# Patient Record
Sex: Male | Born: 1965 | Hispanic: No | Marital: Married | Smoking: Never smoker
Health system: Southern US, Community
[De-identification: ages and names within clinical notes are randomized; demographics above are authoritative.]

## PROBLEM LIST (undated history)

## (undated) DIAGNOSIS — K760 Fatty (change of) liver, not elsewhere classified: Secondary | ICD-10-CM

## (undated) DIAGNOSIS — H269 Unspecified cataract: Secondary | ICD-10-CM

## (undated) DIAGNOSIS — R112 Nausea with vomiting, unspecified: Secondary | ICD-10-CM

## (undated) DIAGNOSIS — L219 Seborrheic dermatitis, unspecified: Secondary | ICD-10-CM

## (undated) DIAGNOSIS — D696 Thrombocytopenia, unspecified: Secondary | ICD-10-CM

## (undated) DIAGNOSIS — H1010 Acute atopic conjunctivitis, unspecified eye: Secondary | ICD-10-CM

## (undated) DIAGNOSIS — M1990 Unspecified osteoarthritis, unspecified site: Secondary | ICD-10-CM

## (undated) DIAGNOSIS — I10 Essential (primary) hypertension: Secondary | ICD-10-CM

## (undated) DIAGNOSIS — Z9889 Other specified postprocedural states: Secondary | ICD-10-CM

## (undated) HISTORY — DX: Unspecified cataract: H26.9

## (undated) HISTORY — DX: Seborrheic dermatitis, unspecified: L21.9

## (undated) HISTORY — DX: Unspecified osteoarthritis, unspecified site: M19.90

## (undated) HISTORY — PX: HERNIA REPAIR: SHX51

## (undated) HISTORY — PX: HEMORRHOID BANDING: SHX5850

## (undated) HISTORY — DX: Acute atopic conjunctivitis, unspecified eye: H10.10

## (undated) HISTORY — DX: Thrombocytopenia, unspecified: D69.6

## (undated) HISTORY — DX: Fatty (change of) liver, not elsewhere classified: K76.0

## (undated) HISTORY — DX: Essential (primary) hypertension: I10

## (undated) HISTORY — PX: COLONOSCOPY: SHX174

---

## 2008-04-10 ENCOUNTER — Encounter: Payer: Self-pay | Admitting: Unknown Physician Specialty

## 2009-08-09 ENCOUNTER — Ambulatory Visit: Payer: Self-pay | Admitting: Internal Medicine

## 2009-09-09 ENCOUNTER — Ambulatory Visit: Payer: Self-pay | Admitting: Internal Medicine

## 2009-09-25 ENCOUNTER — Ambulatory Visit: Payer: Self-pay | Admitting: Internal Medicine

## 2009-10-07 ENCOUNTER — Emergency Department: Payer: Self-pay | Admitting: Unknown Physician Specialty

## 2009-10-09 ENCOUNTER — Ambulatory Visit: Payer: Self-pay | Admitting: Internal Medicine

## 2009-11-09 ENCOUNTER — Ambulatory Visit: Payer: Self-pay | Admitting: Internal Medicine

## 2010-01-13 ENCOUNTER — Ambulatory Visit: Payer: Self-pay | Admitting: Internal Medicine

## 2010-02-07 ENCOUNTER — Ambulatory Visit: Payer: Self-pay | Admitting: Internal Medicine

## 2010-04-09 ENCOUNTER — Ambulatory Visit: Payer: Self-pay | Admitting: Internal Medicine

## 2010-04-14 ENCOUNTER — Ambulatory Visit: Payer: Self-pay | Admitting: Internal Medicine

## 2010-05-09 ENCOUNTER — Ambulatory Visit: Payer: Self-pay | Admitting: Internal Medicine

## 2010-11-24 ENCOUNTER — Emergency Department: Payer: Self-pay | Admitting: Emergency Medicine

## 2010-12-04 ENCOUNTER — Ambulatory Visit: Admit: 2010-12-04 | Payer: Self-pay | Admitting: Cardiovascular Disease

## 2011-12-16 ENCOUNTER — Emergency Department: Payer: Self-pay | Admitting: Emergency Medicine

## 2012-08-15 ENCOUNTER — Telehealth: Payer: Self-pay

## 2012-08-15 NOTE — Telephone Encounter (Signed)
Dr Katrinka Blazing - this is a patient from [place]. He saw you in July for a CPE and his BP meds have run out and the pharmacy refuses to fill them. Can you fix this with the pharmacy or does he have to come in.  [facility] [address] [employer].  Call his wife at [phone] to discuss.

## 2012-08-15 NOTE — Telephone Encounter (Signed)
Left message for him to call me back, so I can advise him the office in [place], [facility]) can renew the medication until patient has follow up here.

## 2012-08-16 NOTE — Telephone Encounter (Signed)
Lm to call me back.  

## 2013-01-06 ENCOUNTER — Ambulatory Visit: Payer: Self-pay

## 2013-04-20 ENCOUNTER — Ambulatory Visit (INDEPENDENT_AMBULATORY_CARE_PROVIDER_SITE_OTHER): Payer: Managed Care, Other (non HMO) | Admitting: Internal Medicine

## 2013-04-20 ENCOUNTER — Encounter: Payer: Self-pay | Admitting: Internal Medicine

## 2013-04-20 VITALS — BP 118/72 | HR 74 | Temp 97.9°F | Resp 16 | Ht 67.0 in | Wt 153.2 lb

## 2013-04-20 DIAGNOSIS — M1990 Unspecified osteoarthritis, unspecified site: Secondary | ICD-10-CM

## 2013-04-20 DIAGNOSIS — M129 Arthropathy, unspecified: Secondary | ICD-10-CM

## 2013-04-20 DIAGNOSIS — I10 Essential (primary) hypertension: Secondary | ICD-10-CM

## 2013-04-20 DIAGNOSIS — E785 Hyperlipidemia, unspecified: Secondary | ICD-10-CM

## 2013-04-20 DIAGNOSIS — R7989 Other specified abnormal findings of blood chemistry: Secondary | ICD-10-CM

## 2013-04-20 DIAGNOSIS — Z1159 Encounter for screening for other viral diseases: Secondary | ICD-10-CM

## 2013-04-20 HISTORY — DX: Unspecified osteoarthritis, unspecified site: M19.90

## 2013-04-20 LAB — RHEUMATOID FACTOR: Rheumatoid fact SerPl-aCnc: 10 [IU]/mL

## 2013-04-20 LAB — IRON AND TIBC: TIBC: 333 ug/dL (ref 215–435)

## 2013-04-20 LAB — COMPREHENSIVE METABOLIC PANEL
ALT: 38 U/L (ref 0–53)
Albumin: 4.3 g/dL (ref 3.5–5.2)
CO2: 24 mEq/L (ref 19–32)
Chloride: 108 mEq/L (ref 96–112)
GFR: 139.47 mL/min (ref >60.00)
Glucose, Bld: 104 mg/dL — ABNORMAL HIGH (ref 70–99)
Potassium: 3.8 mEq/L (ref 3.5–5.1)
Sodium: 138 mEq/L (ref 135–145)
Total Protein: 7.2 g/dL (ref 6.0–8.3)

## 2013-04-20 LAB — C-REACTIVE PROTEIN: CRP: <0.5 mg/dL (ref 0.5–20.0)

## 2013-04-20 LAB — FERRITIN: Ferritin: 273.8 ng/mL (ref 22.0–322.0)

## 2013-04-20 LAB — URIC ACID: Uric Acid, Serum: 6.4 mg/dL (ref 4.0–7.8)

## 2013-04-20 MED ORDER — MELOXICAM 15 MG PO TABS: 15.0000 mg | ORAL_TABLET | Freq: Every day | ORAL | Status: DC

## 2013-04-20 MED ORDER — TRAMADOL HCL 50 MG PO TABS: 50.0000 mg | ORAL_TABLET | Freq: Three times a day (TID) | ORAL | Status: DC | PRN

## 2013-04-20 NOTE — Assessment & Plan Note (Addendum)
Screening serologies for inflammatory arthritis are normal. Right shoulder, left knee and left elbow ordered, but on exam there are no effusions noted and minimal crepitus. Pending evaluation of renal function ,  NSAIDs were prescribed and prn tylenol.

## 2013-04-20 NOTE — Patient Instructions (Addendum)
PYou may use the tramadol every 8 hours as needed for pain  You may start the meloxicam once I review your labs from today.  You can go to the [place] office at [facility] for you x rays today between 8 ann 5 Pm  Return in 3 weeks

## 2013-04-20 NOTE — Progress Notes (Signed)
Patient ID: Julian Bates, male   DOB: 1966-03-21, 47 y.o.   MRN: [id number]  Patient Active Problem List   Diagnosis Date Noted  . Essential hypertension, benign 04/23/2013  . Degenerative arthritis 04/20/2013    Subjective:  CC:   Chief Complaint  Patient presents with  . Establish Care    Mole check below right ear.    HPI:   Julian Bates is a 47 y.o. male who presents as a new patient to establish primary care with the chief complaint of joint pain for several months to a year.  He is Spanish speaking only and is accompanied only by his wife who is providing interpretation today.Marland Kitchen  Specific joint s include left elbow and knee,  And right shoulder.  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  Pulling, pushing and turning aggravate pain., but more recently he has noted pain with everyday use of arm and knee . Seen by an orthopedics who  injected the elbow and rxd a knee brace for the knee.  No x rays were done.  Told his pain was normal , and was not given any NSAIDs o pain relievers and no instructions on use of OTCs.  Knee pain is aggravated by going up and down stairs and has given out on several episodes while going down the stairs without causing any falls.  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  No history of heat or redness.    The cortisone elbow injection was done in February and helped for a while.  He tried to return last month for a second shot  But left after waiting for 40 minutes.   There is a FH of arthritis  In mother at elderly age,  May be RA , but she is in a lot of pain and lives in [place]   Father has prostate Ca ,  PAD, DM, HTN alcohol and tobacco abuse  ,  Now bedbound secondary to amputation and paralysis   History of neutropenia  and anemia  Referred to the [facility] a few  years ago at [facility]  For evaluation .   Past Medical History  Diagnosis Date  . Hypertension     Past Surgical History  Procedure Laterality Date  . Hernia repair      Family History  Problem Relation Age of Onset  . Arthritis Mother   . Arthritis Father   . Cancer Father   . Stroke Father   . Hypertension Father     History   Social History  . Marital Status: Married    Spouse Name: N/A    Number of Children: N/A  . Years of Education: N/A   Occupational History  . Not on file.   Social History Main Topics  . Smoking status: Never Smoker   . Smokeless tobacco: Never Used  . Alcohol Use: Yes     Comment: rarely drinks due to Red blood cell being low  . Drug Use: No  . Sexually Active: Yes   Other Topics Concern  . Not on file   Social History Narrative  . No narrative on file       @ALLHX @    Review of Systems:   The remainder of the review of systems was negative except those addressed in the HPI.       Objective:  BP 118/72  Pulse 74  Temp(Src) 97.9 F (36.6 C) (Oral)  Resp 16  Ht 5\' 7"  (1.702 m)  Wt 153 lb 4 oz (69.514 kg)  BMI 24 kg/m2  SpO2 99%  General appearance: alert, cooperative and appears stated age Ears: normal TM's and external ear canals both ears Throat: lips, mucosa, and tongue normal; teeth and gums normal Neck: no adenopathy, no carotid bruit, supple, symmetrical, trachea midline and thyroid not enlarged, symmetric, no tenderness/mass/nodules Back: symmetric, no curvature. ROM normal. No CVA tenderness. Lungs: clear to auscultation bilaterally Heart: regular rate and rhythm, S1, S2 normal, no murmur, click, rub or gallop Abdomen: soft, non-tender; bowel sounds normal; no masses,  no organomegaly Pulses: 2+ and symmetric Skin: Skin color, texture, turgor normal. No rashes or lesions Lymph nodes: Cervical, supraclavicular, and axillary nodes normal.  Assessment and Plan:  Degenerative arthritis Screening  serologies for inflammatory arthritis are normal. Right shoulder, left knee and left elbow ordered, but on exam there are no effusions noted and minimal crepitus. Pending evaluation of renal function ,  NSAIDs were prescribed and prn tylenol.    Essential hypertension, benign Well controlled on current regimen. Renal function is normal , no changes today.   Updated Medication List Outpatient Encounter Prescriptions as of 04/20/2013  Medication Sig Dispense Refill  . acetic acid-hydrocortisone (VOSOL-HC) otic solution Place 3 drops into both ears 2 (two) times daily.      Marland Kitchen atenolol (TENORMIN) 50 MG tablet Take 50 mg by mouth daily.      . enalapril (VASOTEC) 20 MG tablet Take 20 mg by mouth daily.      . valACYclovir (VALTREX) 1000 MG tablet Take 1,000 mg by mouth 2 (two) times daily.      . meloxicam (MOBIC) 15 MG tablet Take 1 tablet (15 mg total) by mouth daily.  30 tablet  0  . traMADol (ULTRAM) 50 MG tablet Take 1 tablet (50 mg total) by mouth every 8 (eight) hours as needed for pain.  90 tablet  1   No facility-administered encounter medications on file as of 04/20/2013.     Orders Placed This Encounter  Procedures  . DG Shoulder Right  . DG Elbow Complete Left  . DG Knee Complete 4 Views Left  . Hepatitis C antibody  . Hepatitis B surface antigen  . Hepatitis B core antibody, total  . Uric acid  . Rheumatoid factor  . C-reactive protein  . ANA w/Reflex if Positive  . Comprehensive metabolic panel  . Lipid panel  . Ferritin  . Iron and TIBC    Return in about 3 weeks (around 05/11/2013).

## 2013-04-21 LAB — ANA W/REFLEX IF POSITIVE: Anti Nuclear Antibody(ANA): NEGATIVE

## 2013-04-23 ENCOUNTER — Encounter: Payer: Self-pay | Admitting: Internal Medicine

## 2013-04-23 DIAGNOSIS — I10 Essential (primary) hypertension: Secondary | ICD-10-CM | POA: Insufficient documentation

## 2013-04-23 NOTE — Assessment & Plan Note (Signed)
Well controlled on current regimen. Renal function is normal , no changes today.

## 2013-04-25 ENCOUNTER — Telehealth: Payer: Self-pay | Admitting: *Deleted

## 2013-04-25 ENCOUNTER — Telehealth: Payer: Self-pay | Admitting: Internal Medicine

## 2013-04-25 MED ORDER — CEPHALEXIN 500 MG PO TABS: 500.0000 mg | ORAL_TABLET | Freq: Three times a day (TID) | ORAL | Status: DC

## 2013-04-25 NOTE — Telephone Encounter (Signed)
Please confirm if he is taking meloxicam as well.  Tramadol and meloxicam were prescribed. He has not had the x rays yet that i ordered,  He can take meloxicam once dailky and otc tylenol 500 mg every 6 hours.  For the redness, I HAVE CALLED in cephalexin, an antibiotic to take.  contineu cool compresses to arm

## 2013-04-25 NOTE — Telephone Encounter (Signed)
Patient Information:  Caller Name: Aldean Jewett  Phone: [phone]  Patient: Julian Bates, Julian Bates  Gender: Male  DOB: 1966/03/13  Age: 47 Years  PCP: Duncan Dull (Adults only)  Office Follow Up:  Does the office need to follow up with this patient?: No  Instructions For The Office: N/A  RN Note:  Afebrile. OV 04/20/2013 for arthritis and had blood drawm. The left arm, where blood was taken is ecchymotic,edemetous and sore (woke him up from sleep last night). Today, 04/25/2013 no cardiac symptoms or pain. Pain is coming and going from where the "needle stick" was. Also,Tramadol was ordered for arthritic pain and after 2 hours of taking the medication, he told his wife he got diaphoretic, heart racing, hot and nauseated and had to leave work (04/24/2013),which he would normally not do. She is concerned about the pain in arm from needle stick and states the ecchymotic area is very large, whole forearm area and the edema is not decreasing with cold compress. He also needs an alternate pain medication from the Tramadol. RN/CAN offered appointment today and she refused, her husband just left for work. RN/CAN called office to schedule appointment for tomorrow, 04/26/2013 and "Amber" scheduled for 9:30am, Millie confirmed. RN/CAN advised calling back if any other issues. Wife stated it was getting better and the edema is not the whole arm just a lump by the injection site. It is painful and RN/CAN advised going to the ED if unable to do his job.  Symptoms  Reason For Call & Symptoms: prescribed medication for joint pain in his elbow and got sick from it yesterday, 04/24/2013. Nauseated, hot, diaphoretic.  Reviewed Health History In EMR: Yes  Reviewed Medications In EMR: Yes  Reviewed Allergies In EMR: Yes  Reviewed Surgeries / Procedures: Yes  Date of Onset of Symptoms: 04/24/2013  Guideline(s) Used:  Arm Pain  Disposition Per Guideline:   See Today or Tomorrow in Office  Reason For Disposition Reached:    Localized pain, redness or hard lump along vein  Advice Given:  N/A  Patient Will Follow Care Advice:  YES  Appointment Scheduled:  04/26/2013 09:30:00 Appointment Scheduled Provider:  Orville Govern

## 2013-04-25 NOTE — Telephone Encounter (Signed)
FYI: has appt tomorrow (6/18) with Merton Border, NP

## 2013-04-25 NOTE — Telephone Encounter (Signed)
Patient sates new medications causing nausea and vomiting and would like something different prescribed please advise.

## 2013-04-26 ENCOUNTER — Encounter: Payer: Self-pay | Admitting: Adult Health

## 2013-04-26 ENCOUNTER — Ambulatory Visit (INDEPENDENT_AMBULATORY_CARE_PROVIDER_SITE_OTHER): Payer: Managed Care, Other (non HMO) | Admitting: Adult Health

## 2013-04-26 VITALS — BP 112/68 | HR 73 | Temp 98.5°F | Resp 12 | Wt 155.5 lb

## 2013-04-26 DIAGNOSIS — G8929 Other chronic pain: Secondary | ICD-10-CM | POA: Insufficient documentation

## 2013-04-26 DIAGNOSIS — M25522 Pain in left elbow: Secondary | ICD-10-CM

## 2013-04-26 DIAGNOSIS — M25529 Pain in unspecified elbow: Secondary | ICD-10-CM

## 2013-04-26 DIAGNOSIS — M25569 Pain in unspecified knee: Secondary | ICD-10-CM

## 2013-04-26 DIAGNOSIS — M25562 Pain in left knee: Secondary | ICD-10-CM | POA: Insufficient documentation

## 2013-04-26 DIAGNOSIS — R52 Pain, unspecified: Secondary | ICD-10-CM

## 2013-04-26 MED ORDER — ESOMEPRAZOLE MAGNESIUM 40 MG PO PACK: 40.0000 mg | PACK | Freq: Every day | ORAL | Status: DC

## 2013-04-26 MED ORDER — HYDROCORTISONE-ACETIC ACID 1-2 % OT SOLN: 3.0000 [drp] | Freq: Two times a day (BID) | OTIC | Status: DC

## 2013-04-26 MED ORDER — ACETAMINOPHEN 500 MG PO TABS: 500.0000 mg | ORAL_TABLET | Freq: Four times a day (QID) | ORAL | Status: DC | PRN

## 2013-04-26 NOTE — Assessment & Plan Note (Addendum)
Acute pain of left antecubital fossa. Started on Keflex for imperical tx for cellulitis. Arm improved; however, pain continues. Patient will take mobic with tylenol. He understands that mobic is once daily and tylenol can be taken every 6 hours. He is to take one dose of tylenol with the mobic for potentiation. Start nexium while on mobic. Note, greater than 25 min were spent in face to face communication with pain and family in the assessment, planning and implementation of problems listed during today's visit.

## 2013-04-26 NOTE — Assessment & Plan Note (Addendum)
Refer to ortho. Patient has received cortisone injection in left elbow (12/2012) with good results. Recent screening serologies for inflammatory arthritis are normal. Exam reveals no effusions of left elbow. Continue mobic 15 mg daily, tylenol 1000 mg no more than 4 times a day. Instructed that tylenol has maximum of 4000 mg daily that is not to be exceeded.

## 2013-04-26 NOTE — Progress Notes (Signed)
  Subjective:    Patient ID: Julian Bates, male    DOB: 18-Aug-1966, 47 y.o.   MRN: [id number]  HPI  Patient is a pleasant 47 year old Spanish-speaking male who presents to clinic with left arm edema, erythema, pain subsequent to recent blood draw. He was prescribed tramadol however he became nauseated and diaphoretic after taking medication. Patient was prescribed Keflex to treat empirically for cellulitis and was told to make an appointment for followup. Patient reports chronic pain in his left elbow. This new pain is in the antecubital fossa. He reports sharp like sensation with mobility and pain that keeps him up during the night. Denies fever, chills. He reports inflammation has subsided as well as the erythema.  For his chronic left elbow pain, patient has received cortisone injection x1 back in February of 2014. He also has degenerative arthritis changes of his left knee with chronic pain. He has seen an orthopedic in the past but felt that his knee pain that was not addressed. He would like a referral to a new orthopedic.   Current Outpatient Prescriptions on File Prior to Visit  Medication Sig Dispense Refill  . acetic acid-hydrocortisone (VOSOL-HC) otic solution Place 3 drops into both ears 2 (two) times daily.      Marland Kitchen atenolol (TENORMIN) 50 MG tablet Take 50 mg by mouth daily.      . enalapril (VASOTEC) 20 MG tablet Take 20 mg by mouth daily.      . valACYclovir (VALTREX) 1000 MG tablet Take 1,000 mg by mouth 2 (two) times daily.      . Cephalexin 500 MG tablet Take 1 tablet (500 mg total) by mouth 3 (three) times daily.  21 tablet  0  . meloxicam (MOBIC) 15 MG tablet Take 1 tablet (15 mg total) by mouth daily.  30 tablet  0   No current facility-administered medications on file prior to visit.    Review of Systems  Musculoskeletal: Positive for arthralgias. Negative for joint swelling.       Pain in left elbow, antecubital fossa and left knee   BP 112/68  Pulse 73   Temp(Src) 98.5 F (36.9 C) (Oral)  Resp 12  Wt 155 lb 8 oz (70.534 kg)  BMI 24.35 kg/m2  SpO2 97%    Objective:   Physical Exam  Constitutional: He is oriented to person, place, and time. He appears well-developed and well-nourished. No distress.  Cardiovascular: Normal rate and regular rhythm.   Pulmonary/Chest: Effort normal. No respiratory distress.  Musculoskeletal: He exhibits tenderness. He exhibits no edema.       Left elbow: He exhibits no effusion and no deformity. Tenderness found. Medial epicondyle, lateral epicondyle and olecranon process tenderness noted.       Left knee: Normal. He exhibits no swelling, no effusion, no deformity and no erythema.       Arms:      Legs: Neurological: He is alert and oriented to person, place, and time.  Psychiatric: He has a normal mood and affect. His behavior is normal. Judgment and thought content normal.       Assessment & Plan:

## 2013-04-26 NOTE — Telephone Encounter (Signed)
Patient is taking the meloxicam  And saw Orville Govern NP today, patient notified as directed.

## 2013-04-26 NOTE — Patient Instructions (Addendum)
  Empieza a tomar el antibiotico como fue indicado.  Continue el Mobic diariamente.   Tome Tylenol 1000 mg no mas de 4 veces al dia. Tylenol tiene un maximo de 4000 mg diario.  Lo estoy refiriendo a un ortopedico para el codo y la rodilla.

## 2013-04-26 NOTE — Addendum Note (Signed)
Addended by: Dennie Bible on: 04/26/2013 10:56 AM   Modules accepted: Orders

## 2013-04-26 NOTE — Assessment & Plan Note (Signed)
Refer to Ortho. Exam reveals crepitus of left knee without any s/s of effusion. Patient has seen ortho in the past but feels his left knee pain was not addressed. Continue mobic and tylenol as instructed. Started nexium while on mobic.

## 2013-05-31 ENCOUNTER — Ambulatory Visit (INDEPENDENT_AMBULATORY_CARE_PROVIDER_SITE_OTHER)
Admission: RE | Admit: 2013-05-31 | Discharge: 2013-05-31 | Disposition: A | Discharge status: Home / Self Care | Payer: Managed Care, Other (non HMO) | Source: Ambulatory Visit | Attending: Family Medicine | Admitting: Family Medicine

## 2013-05-31 ENCOUNTER — Institutional Professional Consult (permissible substitution): Payer: Managed Care, Other (non HMO) | Admitting: Family Medicine

## 2013-05-31 ENCOUNTER — Ambulatory Visit (INDEPENDENT_AMBULATORY_CARE_PROVIDER_SITE_OTHER): Payer: Managed Care, Other (non HMO) | Admitting: Family Medicine

## 2013-05-31 ENCOUNTER — Encounter: Payer: Self-pay | Admitting: Family Medicine

## 2013-05-31 VITALS — BP 100/70 | HR 70 | Temp 97.5°F | Ht 67.0 in | Wt 158.5 lb

## 2013-05-31 DIAGNOSIS — M25529 Pain in unspecified elbow: Secondary | ICD-10-CM

## 2013-05-31 DIAGNOSIS — M25569 Pain in unspecified knee: Secondary | ICD-10-CM

## 2013-05-31 DIAGNOSIS — M7702 Medial epicondylitis, left elbow: Secondary | ICD-10-CM

## 2013-05-31 DIAGNOSIS — M7711 Lateral epicondylitis, right elbow: Secondary | ICD-10-CM

## 2013-05-31 DIAGNOSIS — M25562 Pain in left knee: Secondary | ICD-10-CM

## 2013-05-31 DIAGNOSIS — M7700 Medial epicondylitis, unspecified elbow: Secondary | ICD-10-CM

## 2013-05-31 DIAGNOSIS — G8929 Other chronic pain: Secondary | ICD-10-CM

## 2013-05-31 DIAGNOSIS — M7710 Lateral epicondylitis, unspecified elbow: Secondary | ICD-10-CM

## 2013-05-31 DIAGNOSIS — M222X1 Patellofemoral disorders, right knee: Secondary | ICD-10-CM

## 2013-05-31 DIAGNOSIS — M7712 Lateral epicondylitis, left elbow: Secondary | ICD-10-CM

## 2013-05-31 NOTE — Progress Notes (Signed)
Date:  05/31/2013   Name:  Julian Bates   DOB:  03-19-1966   MRN:  [id number] Gender: male Age: 47 y.o.  Primary Physician: Duncan Dull, MD  Dear Dr. Hassie Bruce,  Thank you for having me see Julian Bates in consultation today at [facility] at [facility] for his problem with elbow and knee pain.  As you may recall, he is a 47 y.o. year old male with a history of longstanding left elbow pain, current significant R elbow pain and LEFT > RIGHT knee pain.   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 Step and weight - gives out sometimes. Weak. No effusions, and he denies locking up of the joint.  When going up steps and ladders, it will hurt a lot. He uses a knee brace every day - found one [facility] that helps. Also saw Dr. Richardson Landry twice. Feels some crepitus. No prior knee surgery.  Broke L elbow. Wore cast as a ytounger man. Has had lateral elbow pain at lateral epicondyle. He now does repetitive movements with his hands at work. He had an ECRB injection by Dr. Eulah Pont which helped about 8-9 months. He also is using elbow braces that help some. He has now developed medial epicondylar pain.  Right now his R elbow is the most symptomatic at LE.   Past Medical History  Diagnosis Date  . Hypertension     Past Surgical History  Procedure Laterality Date  . Hernia repair      History   Social History  . Marital Status: Married    Spouse Name: N/A    Number of Children: N/A  . Years of Education: N/A   Social History Main Topics  . Smoking status: Never Smoker   . Smokeless tobacco: Never Used  . Alcohol Use: Yes     Comment: rarely drinks due to Red blood cell being low  . Drug Use: No  . Sexually Active: Yes   Other Topics Concern  . None   Social History Narrative  . None    Family History  Problem Relation Age of Onset  . Arthritis Mother   . Arthritis Father   . Cancer Father   . Stroke Father   .  Hypertension Father     Medications and Allergies reviewed  Outpatient Prescriptions Prior to Visit  Medication Sig Dispense Refill  . atenolol (TENORMIN) 50 MG tablet Take 50 mg by mouth daily.      . enalapril (VASOTEC) 20 MG tablet Take 20 mg by mouth daily.      . valACYclovir (VALTREX) 1000 MG tablet Take 1,000 mg by mouth 2 (two) times daily.      Marland Kitchen acetaminophen (TYLENOL) 500 MG tablet Take 1 tablet (500 mg total) by mouth every 6 (six) hours as needed for pain.  30 tablet  0  . acetic acid-hydrocortisone (VOSOL-HC) otic solution Place 3 drops into both ears 2 (two) times daily.  10 mL  1  . Cephalexin 500 MG tablet Take 1 tablet (500 mg total) by mouth 3 (three) times daily.  21 tablet  0  . esomeprazole (NEXIUM) 40 MG packet Take 40 mg by mouth daily before breakfast.  30 each  12  . meloxicam (MOBIC) 15 MG tablet Take 1 tablet (15 mg total) by mouth daily.  30 tablet  0   No facility-administered medications prior to visit.    Review of Systems:  GEN: No fevers, chills. Nontoxic. Primarily MSK c/o today. MSK: Detailed in the HPI GI: tolerating PO intake without difficulty Neuro: No numbness, parasthesias, or tingling associated. Otherwise, the pertinent positives and negatives are listed above and in the HPI, otherwise a full review of systems has been reviewed and is negative unless noted positive.   Physical Examination: Filed Vitals:   05/31/13 0843  BP: 100/70  Pulse: 70  Temp: 97.5 F (36.4 C)  TempSrc: Oral  Height: 5\' 7"  (1.702 m)  Weight: 158 lb 8 oz (71.895 kg)  SpO2: 99%     GEN: WDWN, NAD, Non-toxic, Alert & Oriented x 3 HEENT: Atraumatic, Normocephalic.  Ears and Nose: No external deformity. EXTR: No clubbing/cyanosis/edema NEURO: Normal gait.  PSYCH: Normally interactive. Conversant. Not depressed or anxious appearing.  Calm demeanor.   Knee: B Gait: Normal heel toe pattern ROM: WNL Effusion: neg Echymosis or edema: none Patellar tendon  NT Painful PLICA: neg Patellar grind: POS Medial and lateral patellar facet loading: POSITIVE, but only mildly medial and lateral joint lines:NT Mcmurray's neg Flexion-pinch neg Varus and valgus stress: stable Lachman: neg Ant and Post drawer: neg Hip abduction, IR, ER: WNL Hip flexion str: 5/5 Hip abd: 5/5 Quad: 5/5 VMO atrophy: MILD Hamstring concentric and eccentric: 5/5  Elbow: B Ecchymosis or edema: neg ROM: full flexion, extension, pronation, supination Shoulder ROM: Full Flexion: 5/5 Extension: 5/5, PAINFUL Supination: 5/5, PAINFUL Pronation: 5/5 Wrist ext: 5/5 Wrist flexion: 5/5 No gross bony abnormality Varus and Valgus stress: stable ECRB tenderness: YES, TTP Medial epicondyle: TTP, but only on the LEFT Lateral epicondyle, resisted wrist extension from wrist full pronation and flexion: PAINFUL grip: 5/5  sensation intact Tinel's, Elbow: negative   Objective Data:  Dg Elbow Complete Left  05/31/2013   *RADIOLOGY REPORT*  Clinical Data: Pain  LEFT ELBOW - COMPLETE 3+ VIEW  Comparison: None.  Findings: Frontal, lateral, and bilateral oblique views were obtained.  No fracture, dislocation, or effusion.  Joint spaces appear intact.  There is a small spur arising from the olecranon.  IMPRESSION: Small olecranon spur.  Study otherwise unremarkable.   Original Report Authenticated By: Bretta Bang, M.D.   Dg Knee Ap/lat W/sunrise Left  05/31/2013   *RADIOLOGY REPORT*  Clinical Data: Left knee pain.  No trauma.  DG KNEE - 3 VIEWS  Comparison: None.  Findings: No fracture or bone lesion.  The knee joint is normally spaced and aligned.  No joint effusion.  The soft tissues are unremarkable.  IMPRESSION: Normal left knee radiographs.   Original Report Authenticated By: Amie Portland, M.D.    Assessment and Plan:  Impression: 1. 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 2. Medial epicondylitis L elbow 3. 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 No significant OA on radiographs.    Recommendations:  1. Lateral Epicondylitis and Medial: Elbow anatomy was reviewed, and tendinopathy was explained. Basic rehab and stretching reviewed.  Use counterforce strap if working or using hands. (Obtain 2nd)  Emphasized stretching an cross-friction massage Emphasized proper palms up lifting biomechanics to unload ECRB  Lateral Epicondylitis Injection, RIGHT Verbal consent was obtained from the patient. Risks, benefits, and alternatives were discussed. Potential complications including loss of pigment, atrophy, and rare risk of infection were discussed. Prepped with Chloraprep and Ethyl Chloride used for anesthesia. Under sterile conditions, the patient was injected at the point of maximal tenderness at the ECRB  tendon with 2/3 cc of Lidocaine 1% and 2/3 cc of Depo-Medrol 40 mg. Decreased pain after injection. No complications.  Needle size: 22 gauge 1 1/2 inch   2. Reassurance that crepitus and PF changes are in normal spectrum for his work history at age 54. Keep fitness level high, strength high, and prn use of knee brace is fine.  We will see the patient back if he is having problems.  Thank you for having [place] see Julian Bates in consultation.  Feel free to contact me with any questions.  Signed, Elpidio Galea. Rochella Benner, MD, CAQ Sports Medicine [employer] at [facility] [address] [place], [place] [address] Phone: [phone] Fax: [phone]

## 2013-07-21 ENCOUNTER — Other Ambulatory Visit: Payer: Self-pay | Admitting: *Deleted

## 2013-07-21 MED ORDER — ATENOLOL 50 MG PO TABS: 50.0000 mg | ORAL_TABLET | Freq: Every day | ORAL | Status: DC

## 2013-08-07 ENCOUNTER — Telehealth: Payer: Self-pay | Admitting: *Deleted

## 2013-08-07 NOTE — Telephone Encounter (Signed)
Refill Request  Desonide 0.05% cream  #30   Apply to face and ears once daily as needed

## 2013-08-14 ENCOUNTER — Other Ambulatory Visit: Payer: Self-pay | Admitting: *Deleted

## 2013-08-14 MED ORDER — ENALAPRIL MALEATE 20 MG PO TABS: 20.0000 mg | ORAL_TABLET | Freq: Every day | ORAL | Status: DC

## 2013-08-14 NOTE — Telephone Encounter (Signed)
Script sent  

## 2013-08-31 IMAGING — CR DG KNEE AP/LAT W/ SUNRISE*L*
3 series · 3 of 3 positions shown · non-contrast
Comparison: None.

CLINICAL DATA: Left knee pain.  No trauma.

DG KNEE - 3 VIEWS

[view not recorded (1 of 3)]
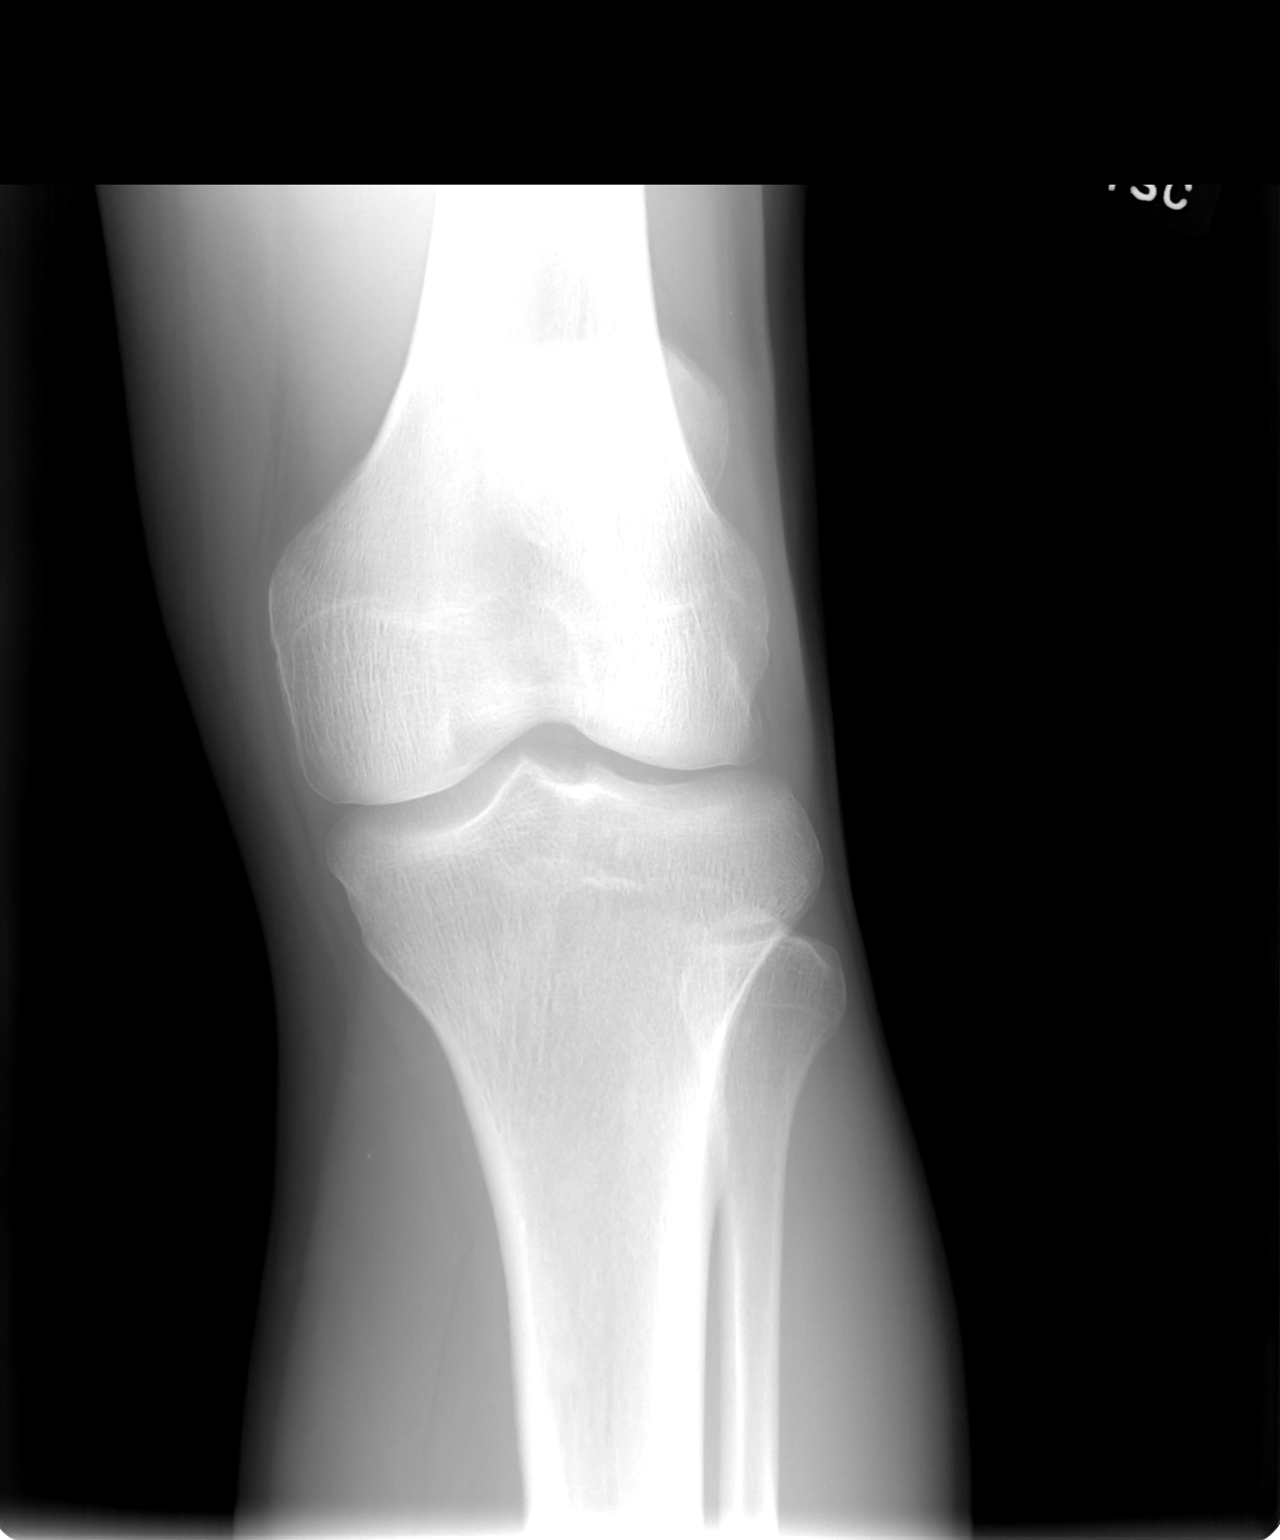

[view not recorded (2 of 3)]
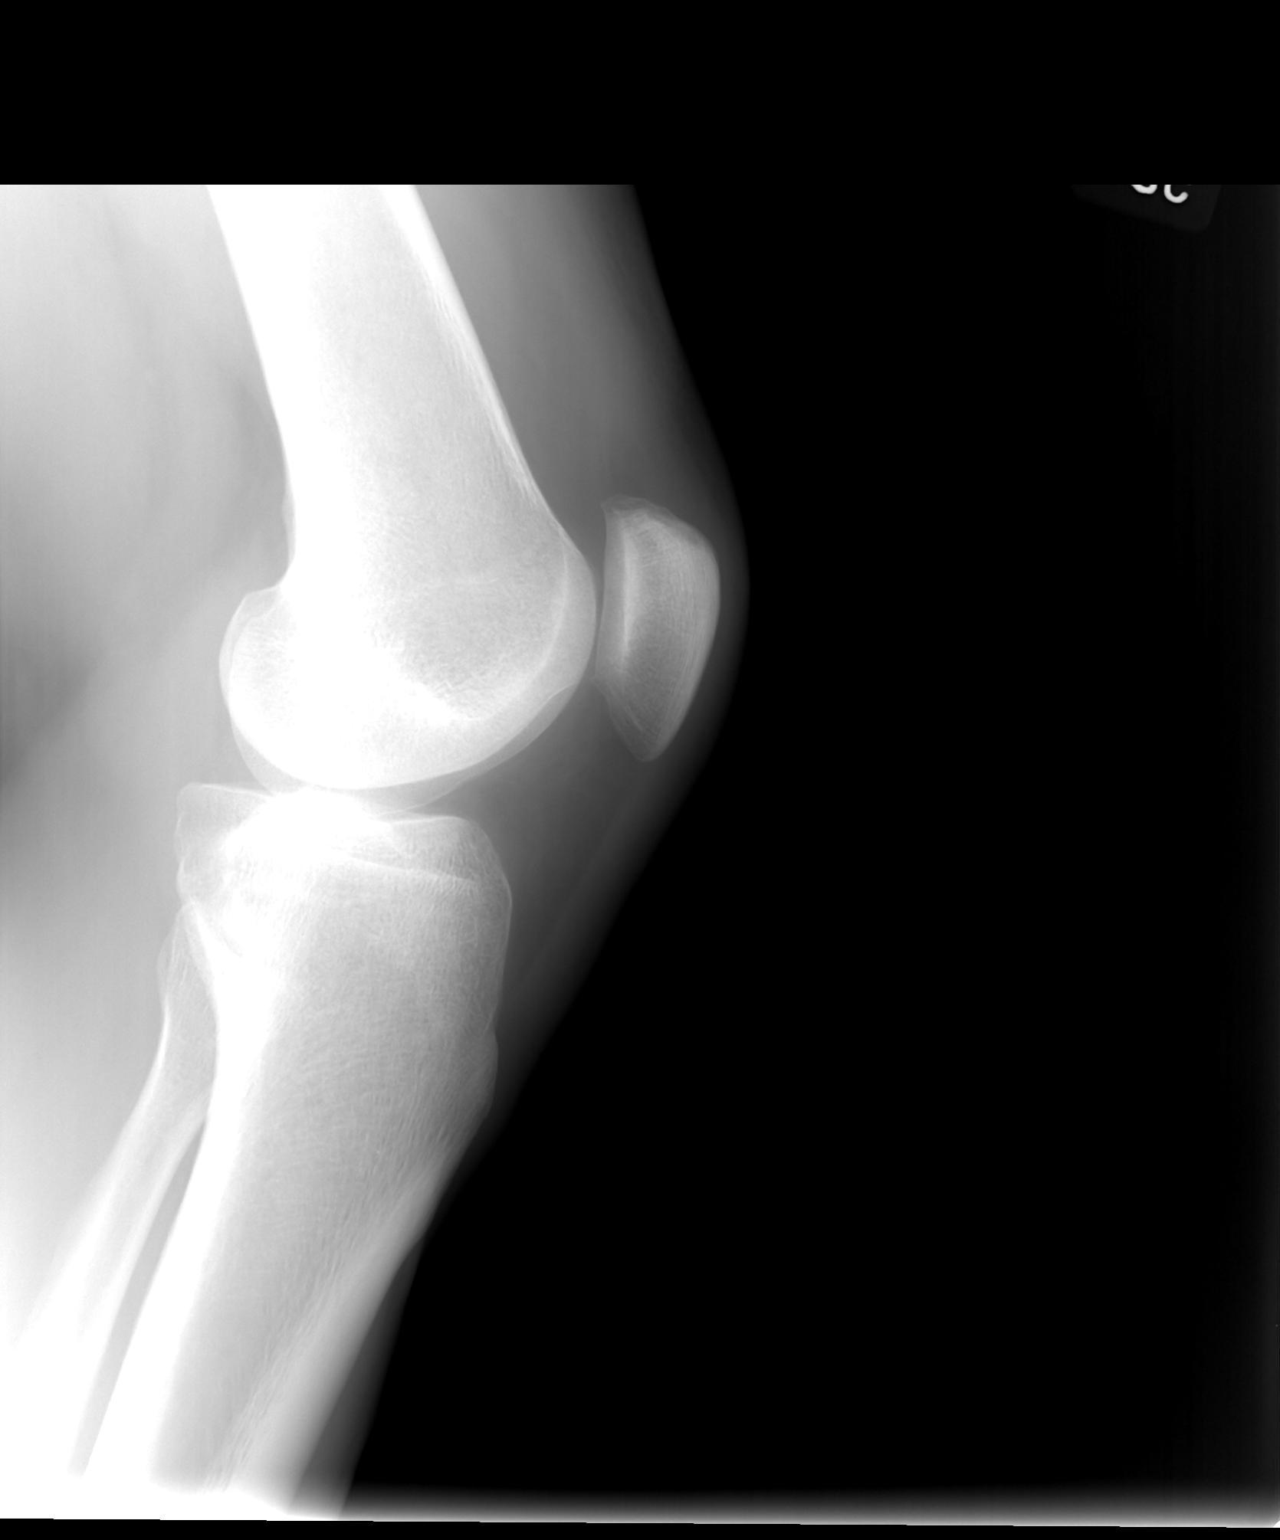

[view not recorded (3 of 3)]
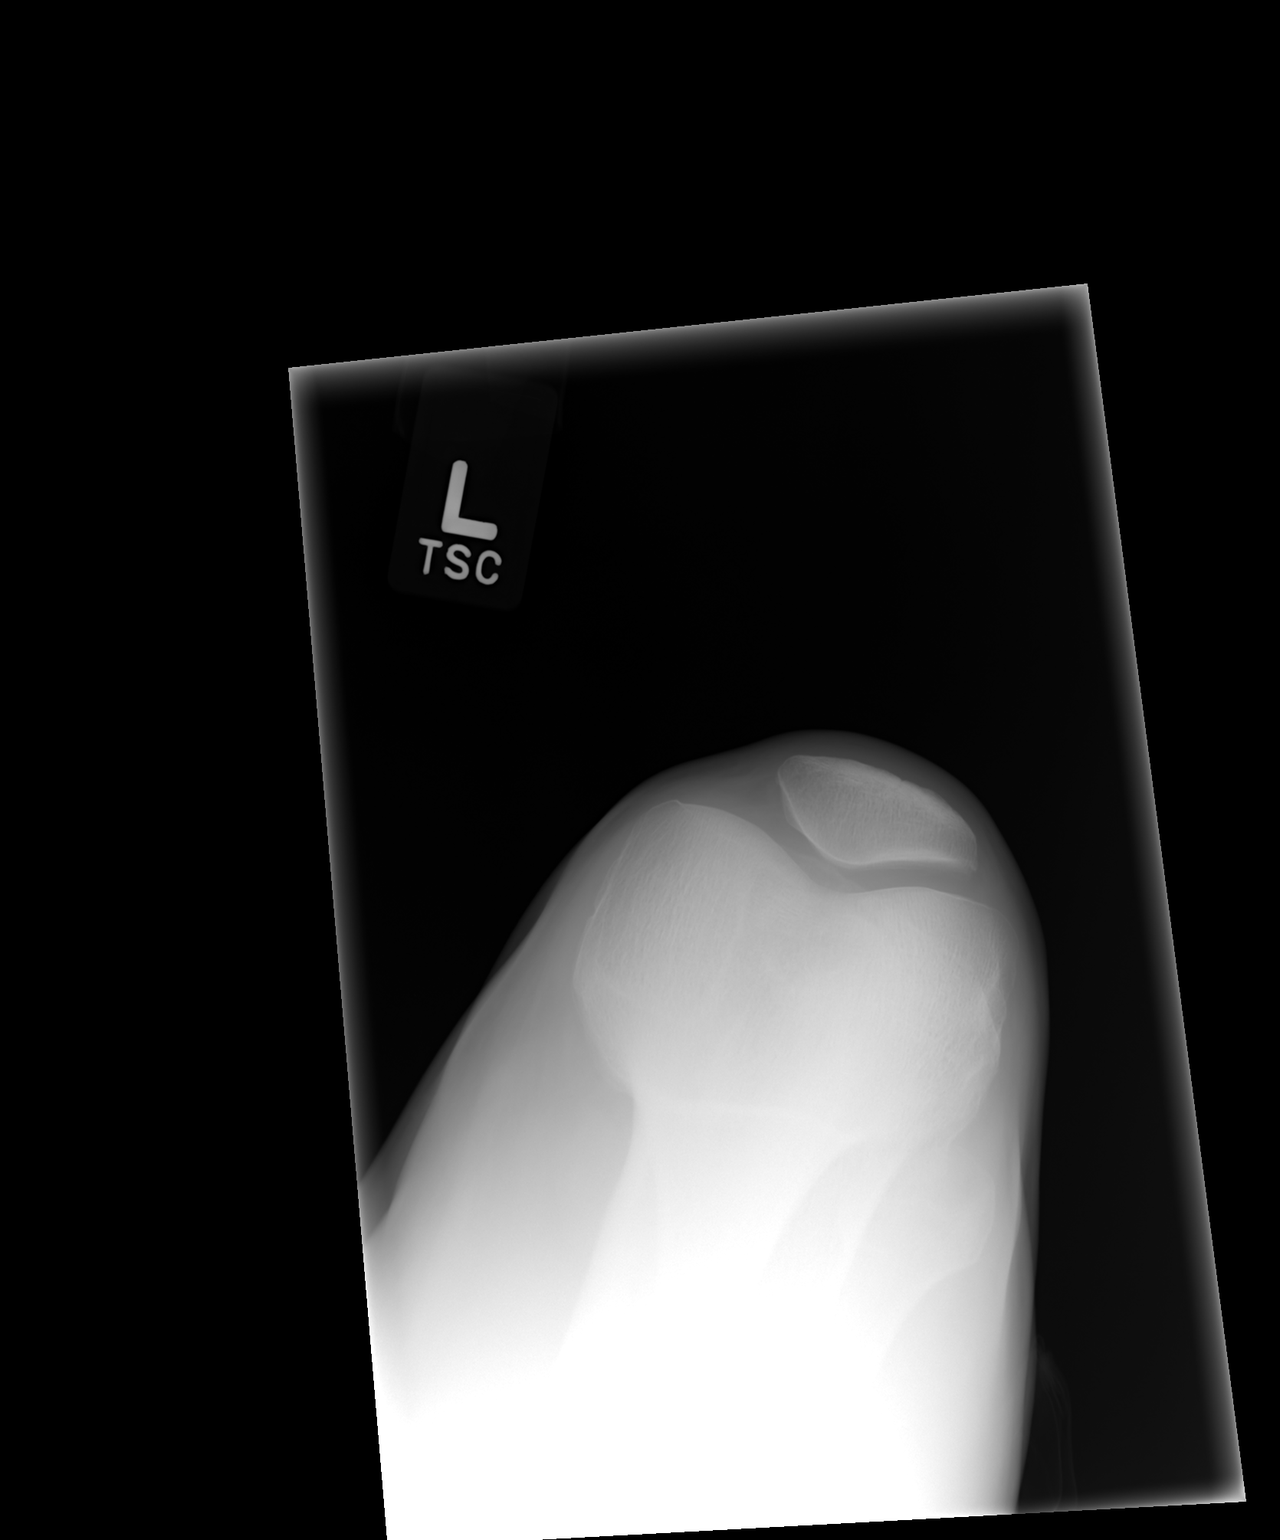

[3 of 3 positions shown; findings below may reference images not displayed]

FINDINGS: No fracture or bone lesion.  The knee joint is normally
spaced and aligned.  No joint effusion.  The soft tissues are
unremarkable.
IMPRESSION: Normal left knee radiographs.

## 2014-01-06 ENCOUNTER — Ambulatory Visit (INDEPENDENT_AMBULATORY_CARE_PROVIDER_SITE_OTHER): Payer: Managed Care, Other (non HMO) | Admitting: Family Medicine

## 2014-01-06 VITALS — BP 110/80 | HR 84 | Temp 97.3°F | Ht 65.25 in | Wt 158.0 lb

## 2014-01-06 DIAGNOSIS — Z23 Encounter for immunization: Secondary | ICD-10-CM

## 2014-01-06 DIAGNOSIS — K644 Residual hemorrhoidal skin tags: Secondary | ICD-10-CM

## 2014-01-06 DIAGNOSIS — I10 Essential (primary) hypertension: Secondary | ICD-10-CM

## 2014-01-06 DIAGNOSIS — H6120 Impacted cerumen, unspecified ear: Secondary | ICD-10-CM

## 2014-01-06 DIAGNOSIS — K625 Hemorrhage of anus and rectum: Secondary | ICD-10-CM

## 2014-01-06 DIAGNOSIS — K5900 Constipation, unspecified: Secondary | ICD-10-CM

## 2014-01-06 LAB — POCT CBC
Granulocyte percent: 70.3 %G (ref 37–80)
HCT, POC: 50.3 % (ref 43.5–53.7)
HEMOGLOBIN: 16.5 g/dL (ref 14.1–18.1)
Lymph, poc: 1.6 (ref 0.6–3.4)
MCH: 32.7 pg — AB (ref 27–31.2)
MCHC: 32.8 g/dL (ref 31.8–35.4)
MCV: 99.9 fL — AB (ref 80–97)
MID (cbc): 0.5 (ref 0–0.9)
MPV: 9.7 fL (ref 0–99.8)
POC Granulocyte: 5 (ref 2–6.9)
POC LYMPH PERCENT: 22 %L (ref 10–50)
POC MID %: 7.7 % (ref 0–12)
Platelet Count, POC: 149 10*3/uL (ref 142–424)
RBC: 5.04 M/uL (ref 4.69–6.13)
RDW, POC: 13 %
WBC: 7.1 10*3/uL (ref 4.6–10.2)

## 2014-01-06 LAB — IFOBT (OCCULT BLOOD): IFOBT: NEGATIVE

## 2014-01-06 MED ORDER — HYDROCORTISONE ACETATE 25 MG RE SUPP: 25.0000 mg | Freq: Two times a day (BID) | RECTAL | Status: DC

## 2014-01-06 MED ORDER — ATENOLOL 50 MG PO TABS: 50.0000 mg | ORAL_TABLET | Freq: Every day | ORAL | Status: DC

## 2014-01-06 MED ORDER — ENALAPRIL MALEATE 20 MG PO TABS: 20.0000 mg | ORAL_TABLET | Freq: Every day | ORAL | Status: DC

## 2014-01-06 NOTE — Patient Instructions (Signed)
Constipación - Adulto   (Constipation, Adult)   Constipación significa que una persona tiene menos de 3 evacuaciones en una semana, hay dificultad para evacuar el intestino, o las heces son secas, duras, o más grandes que lo normal. A medida que envejecemos el estreñimiento es más común. Si intenta curar el estreñimiento con medicamentos que producen la evacuación intestinal (laxantes), el problema puede empeorar. El uso prolongado de laxantes puede hacer que los músculos del colon se debiliten. Una dieta baja en fibra, no tomar suficientes líquidos y el uso de ciertos medicamentos pueden empeorar el estreñimiento.   CAUSAS   · Ciertos medicamentos, como los antidepresivos, analgésicos, suplementos de hierro, antiácidos y diuréticos.    · Algunas enfermedades, como la diabetes, el síndrome del colon irritable (SII), enfermedad de la tiroides, o depresión.    · No beber suficiente agua.    · No consumir suficientes alimentos ricos en fibra.    · Situaciones de estrés o viajes.  · Falta de actividad física o de ejercicio.  · No ir al baño cuando siente la necesidad.  · Ignorar la necesidad súbita de mover el intestino.  · Uso en exceso de laxantes.  SÍNTOMAS   · Evacuar el intestino menos de 3 veces a la semana.    · Dificultad para mover el intestino    · Tener las heces secas y duras, o más grandes que las normales.    · Sensación de estar lleno o distendido.    · Dolor en la parte baja del abdomen  · No se siente alivio después de evacuar el intestino.  DIAGNÓSTICO   El médico le hará una historia clínica y le hará un examen físico. Pueden hacerle exámenes adicionales para el estreñimiento grave. Algunas pruebas son:   · Un radiografía con enema de bario para examinar el recto, el colon y en algunos casos el intestino delgado.  · Una sigmoidoscopia para examinar el colon inferior.  · Una colonoscopia para examinar todo el colon.  TRATAMIENTO   El tratamiento dependerá de la gravedad de la constipación y de la  causa. Algunos tratamientos dietéticos son beber más líquidos y comer más alimentos ricos en fibra. El cambio en el estilo de vida incluye hacer ejercicios de manera regular. Si estas recomendaciones para realizar cambios en la dieta y en el estilo de vida no ayudan, el médico le puede indicar el uso de laxantes de venta libre para favorecer el movimiento intestinal. Los medicamentos con receta se pueden prescribir si los medicamentos de venta libre no lo mejoran.   INSTRUCCIONES PARA EL CUIDADO EN EL HOGAR   · Aumente el consumo de alimentos con fibra, como frutas, verduras, granos enteros y frijoles. Limite los azúcares ricos en grasas y procesados   en su dieta, tales como papas fritas, hamburguesas, galletas, dulces y refrescos.    · Puede agregar un suplemento de fibra a su dieta si no obtiene lo suficiente de los alimentos.    · Debe ingerir gran cantidad de líquido para mantener la orina de tono claro o color amarillo pálido.    · Haga ejercicios regularmente o según las indicaciones de su médico.    · Vaya al baño cuando sienta la necesidad de ir. No espere.  · Tome sólo la medicación que le indicó el profesional.  No tome otros medicamentos para la constipación sin consultar a su médico.  SOLICITE ATENCIÓN MÉDICA DE INMEDIATO SI:   · Observa sangre brillante en las heces.    · La constipación dura más de 4 días o empeora.    · Siente   dolor abdominal o rectal.    · Las heces son delgadas como un lápiz.  · Pierde peso de manera inexplicable.  ASEGÚRESE DE QUE:   · Comprende estas instrucciones.  · Controlará su enfermedad.  · Solicitará ayuda de inmediato si no mejora o empeora.  Document Released: 11/15/2007 Document Revised: 04/26/2012  Exit[employer]® Patient Information ©2014 [employer].

## 2014-01-06 NOTE — Progress Notes (Addendum)
Subjective:    Patient ID: Julian Bates, male    DOB: 1966/05/07, 48 y.o.   MRN: [id number]  HPI Chief Complaint  Patient presents with  . Hemorrhoids    bleeding x  days   This chart was scribed for Julian Chick, MD by Andrew Au, ED Scribe. This patient was seen in room 4 and the patient's care was started at 1:59 PM.  HPI Comments: Julian Bates is a 48 y.o. male who presents to the [facility] complaining of possible hemorrhoids onset 5 days. Pt reports that he has no pain but he feels irritation, itchiness, and has rectal  bleeding. His girlfriend reports constipation and painful bowel movement. Pt reports that he strains to have a bowel movement. He reports that he has been eating fruits and vegetables. Pt reports that he has been using Preparation  H. His wife reports that pt has lost 7 lbs. Pt denies abdominal pain and n/v.    Pt is also complaining of bilateral eye pain/burning. Pt reports that he was seen by the optometrist and was prescribed eye drops without effectiveness.  No eye drainage or tearing.    Pt reports that he also has left sided ear pain but reports that he always has trouble with his ears. Wants evaluation.  Patient reports good compliance and good tolerance of blood pressure medications; denies chest pain, palpitations, SOB, leg swelling.   Past Medical History  Diagnosis Date  . Hypertension   . Seborrhea   . Allergic conjunctivitis   . Arthritis    Past Surgical History  Procedure Laterality Date  . Hernia repair      x 3    Allergies  Allergen Reactions  . Tramadol    Prior to Admission medications   Medication Sig Start Date End Date Taking? Authorizing Provider  atenolol (TENORMIN) 50 MG tablet Take 1 tablet (50 mg total) by mouth daily. 07/21/13   Sherlene Shams, MD  enalapril (VASOTEC) 20 MG tablet Take 1 tablet (20 mg total) by mouth daily. 08/14/13   Sherlene Shams, MD  valACYclovir (VALTREX) 1000 MG  tablet Take 1,000 mg by mouth 2 (two) times daily.    Historical Provider, MD   Review of Systems  Constitutional: Negative for fever, chills, diaphoresis and fatigue.  HENT: Positive for ear pain. Negative for congestion, postnasal drip, rhinorrhea, sinus pressure, sore throat and trouble swallowing.   Eyes: Positive for pain. Negative for photophobia, discharge, redness, itching and visual disturbance.  Respiratory: Negative for cough and shortness of breath.   Cardiovascular: Negative for chest pain, palpitations and leg swelling.  Gastrointestinal: Positive for constipation and anal bleeding. Negative for nausea, vomiting, abdominal pain, diarrhea, blood in stool, abdominal distention and rectal pain.  Genitourinary: Negative for dysuria.  Neurological: Negative for dizziness, tremors, seizures, syncope, facial asymmetry, speech difficulty, weakness, light-headedness, numbness and headaches.      Objective:   Physical Exam  Constitutional: He is oriented to person, place, and time. He appears well-developed and well-nourished. No distress.  HENT:  Head: Normocephalic and atraumatic.  Right Ear: External ear normal.  Left Ear: External ear normal.  Nose: Nose normal.  Mouth/Throat: Oropharynx is clear and moist.  Cerumen in left ear greater than right ear  Eyes: Conjunctivae and EOM are normal. Pupils are equal, round, and reactive to light. Right eye exhibits no discharge. Left eye exhibits no discharge.  Neck: Normal range of motion. Neck supple. Carotid bruit is not present.  No thyromegaly present.  Cardiovascular: Normal rate, regular rhythm, normal heart sounds and intact distal pulses.  Exam reveals no gallop and no friction rub.   No murmur heard. Pulmonary/Chest: Effort normal and breath sounds normal. No respiratory distress. He has no wheezes. He has no rales.  Abdominal: Soft. Bowel sounds are normal. He exhibits no distension and no mass. There is no tenderness. There is no  rebound and no guarding.  Genitourinary: Rectal exam shows external hemorrhoid.  Musculoskeletal: Normal range of motion.  Lymphadenopathy:    He has no cervical adenopathy.  Neurological: He is alert and oriented to person, place, and time. No cranial nerve deficit.  Skin: Skin is warm and dry. No rash noted. He is not diaphoretic.  Psychiatric: He has a normal mood and affect. His behavior is normal.  Nursing note and vitals reviewed.  Filed Vitals:   01/06/14 1358  BP: 110/80  Pulse: 84  Temp: 97.3 F (36.3 C)      Results for orders placed or performed in visit on 01/06/14  POCT CBC  Result Value Ref Range   WBC 7.1 4.6 - 10.2 K/uL   Lymph, poc 1.6 0.6 - 3.4   POC LYMPH PERCENT 22.0 10 - 50 %L   MID (cbc) 0.5 0 - 0.9   POC MID % 7.7 0 - 12 %M   POC Granulocyte 5.0 2 - 6.9   Granulocyte percent 70.3 37 - 80 %G   RBC 5.04 4.69 - 6.13 M/uL   Hemoglobin 16.5 14.1 - 18.1 g/dL   HCT, POC 81.1 91.4 - 53.7 %   MCV 99.9 (A) 80 - 97 fL   MCH, POC 32.7 (A) 27 - 31.2 pg   MCHC 32.8 31.8 - 35.4 g/dL   RDW, POC 78.2 %   Platelet Count, POC 149 142 - 424 K/uL   MPV 9.7 0 - 99.8 fL  IFOBT POC (occult bld, rslt in office)  Result Value Ref Range   IFOBT Negative    INFLUENZA VACCINE ADMINISTERED.  B EARS IRRIGATED IN OFFICE.  Assessment & Plan:   1. Bleeding external hemorrhoids   2. Rectal bleeding   3. Cerumen impaction   4. Constipation, unspecified constipation type   5. Need for prophylactic vaccination and inoculation against influenza   6. Essential hypertension     1. Bleeding hemorrhoids:  New. Rx for Anusol [facility] suppositories provided; also recommend sitz baths daily. 2. Constipation:  New. Recommend Miralax or Colace daily.  Continue with increased fiber and water intake. 3.  Rectal bleeding: New. Secondary to hemorrhoidal irritation; normal H/H. 4. Cerumen Impaction: New. S/p irrigation B ears in office. 5.  HTN: controlled; refills provided; obtain labs;  follow-up in six months. 6.  S/p flu vaccine.   Meds ordered this encounter  Medications  . hydrocortisone (ANUSOL-HC) 25 MG suppository    Sig: Place 1 suppository (25 mg total) rectally 2 (two) times daily.    Dispense:  24 suppository    Refill:  0  . DISCONTD: atenolol (TENORMIN) 50 MG tablet    Sig: Take 1 tablet (50 mg total) by mouth daily.    Dispense:  90 tablet    Refill:  3  . DISCONTD: enalapril (VASOTEC) 20 MG tablet    Sig: Take 1 tablet (20 mg total) by mouth daily.    Dispense:  90 tablet    Refill:  3   I personally performed the services described in this documentation, which was scribed in my  presence.  The recorded information has been reviewed and is accurate.  Nilda SimmerKristi Reeta Kuk, M.D.  [facility]  [place] [address] [place], [place]  [id number] [phone] phone [phone]) [phone] fax

## 2014-01-17 ENCOUNTER — Encounter: Payer: Self-pay | Admitting: Internal Medicine

## 2014-06-18 ENCOUNTER — Other Ambulatory Visit: Payer: Self-pay | Admitting: Internal Medicine

## 2014-07-25 ENCOUNTER — Ambulatory Visit (INDEPENDENT_AMBULATORY_CARE_PROVIDER_SITE_OTHER): Payer: Managed Care, Other (non HMO) | Admitting: Family Medicine

## 2014-07-25 ENCOUNTER — Encounter: Payer: Self-pay | Admitting: Family Medicine

## 2014-07-25 VITALS — BP 120/76 | HR 71 | Temp 97.5°F | Resp 16 | Ht 66.0 in | Wt 155.2 lb

## 2014-07-25 DIAGNOSIS — R0609 Other forms of dyspnea: Secondary | ICD-10-CM

## 2014-07-25 DIAGNOSIS — R0989 Other specified symptoms and signs involving the circulatory and respiratory systems: Secondary | ICD-10-CM

## 2014-07-25 DIAGNOSIS — M1710 Unilateral primary osteoarthritis, unspecified knee: Secondary | ICD-10-CM

## 2014-07-25 DIAGNOSIS — H1045 Other chronic allergic conjunctivitis: Secondary | ICD-10-CM

## 2014-07-25 DIAGNOSIS — M170 Bilateral primary osteoarthritis of knee: Secondary | ICD-10-CM

## 2014-07-25 DIAGNOSIS — L219 Seborrheic dermatitis, unspecified: Secondary | ICD-10-CM

## 2014-07-25 DIAGNOSIS — I10 Essential (primary) hypertension: Secondary | ICD-10-CM

## 2014-07-25 DIAGNOSIS — H1013 Acute atopic conjunctivitis, bilateral: Secondary | ICD-10-CM

## 2014-07-25 DIAGNOSIS — Z125 Encounter for screening for malignant neoplasm of prostate: Secondary | ICD-10-CM

## 2014-07-25 DIAGNOSIS — Z0000 Encounter for general adult medical examination without abnormal findings: Secondary | ICD-10-CM

## 2014-07-25 DIAGNOSIS — R0683 Snoring: Secondary | ICD-10-CM

## 2014-07-25 DIAGNOSIS — L210 Seborrhea capitis: Secondary | ICD-10-CM

## 2014-07-25 DIAGNOSIS — Z13220 Encounter for screening for lipoid disorders: Secondary | ICD-10-CM

## 2014-07-25 DIAGNOSIS — G478 Other sleep disorders: Secondary | ICD-10-CM

## 2014-07-25 DIAGNOSIS — Z23 Encounter for immunization: Secondary | ICD-10-CM

## 2014-07-25 DIAGNOSIS — Z131 Encounter for screening for diabetes mellitus: Secondary | ICD-10-CM

## 2014-07-25 LAB — LIPID PANEL
CHOLESTEROL: 138 mg/dL (ref 0–200)
HDL: 30 mg/dL — AB (ref >39)
LDL Cholesterol: 56 mg/dL (ref 0–99)
TRIGLYCERIDES: 259 mg/dL — AB (ref <150)
Total CHOL/HDL Ratio: 4.6 Ratio
VLDL: 52 mg/dL — AB (ref 0–40)

## 2014-07-25 LAB — COMPLETE METABOLIC PANEL WITHOUT GFR
ALT: 37 U/L (ref 0–53)
AST: 19 U/L (ref 0–37)
Albumin: 4.6 g/dL (ref 3.5–5.2)
Alkaline Phosphatase: 58 U/L (ref 39–117)
BUN: 16 mg/dL (ref 6–23)
CO2: 28 meq/L (ref 19–32)
Calcium: 9.3 mg/dL (ref 8.4–10.5)
Chloride: 102 meq/L (ref 96–112)
Creat: 0.78 mg/dL (ref 0.50–1.35)
GFR, Est African American: >89 mL/min
GFR, Est Non African American: >89 mL/min
Glucose, Bld: 99 mg/dL (ref 70–99)
Potassium: 3.7 meq/L (ref 3.5–5.3)
Sodium: 138 meq/L (ref 135–145)
Total Bilirubin: 1 mg/dL (ref 0.2–1.2)
Total Protein: 7 g/dL (ref 6.0–8.3)

## 2014-07-25 LAB — CBC WITH DIFFERENTIAL/PLATELET
Basophils Absolute: 0 10*3/uL (ref 0.0–0.1)
Basophils Relative: 0 % (ref 0–1)
Eosinophils Absolute: 0.1 10*3/uL (ref 0.0–0.7)
Eosinophils Relative: 1 % (ref 0–5)
HCT: 46.8 % (ref 39.0–52.0)
HEMOGLOBIN: 16.3 g/dL (ref 13.0–17.0)
LYMPHS ABS: 1.4 10*3/uL (ref 0.7–4.0)
Lymphocytes Relative: 19 % (ref 12–46)
MCH: 32.1 pg (ref 26.0–34.0)
MCHC: 34.8 g/dL (ref 30.0–36.0)
MCV: 92.1 fL (ref 78.0–100.0)
Monocytes Absolute: 0.4 10*3/uL (ref 0.1–1.0)
Monocytes Relative: 6 % (ref 3–12)
NEUTROS PCT: 74 % (ref 43–77)
Neutro Abs: 5.4 10*3/uL (ref 1.7–7.7)
Platelets: 134 10*3/uL — ABNORMAL LOW (ref 150–400)
RBC: 5.08 MIL/uL (ref 4.22–5.81)
RDW: 13.3 % (ref 11.5–15.5)
WBC: 7.3 10*3/uL (ref 4.0–10.5)

## 2014-07-25 LAB — POCT URINALYSIS DIPSTICK
Bilirubin, UA: NEGATIVE
Blood, UA: NEGATIVE
Glucose, UA: NEGATIVE
Ketones, UA: NEGATIVE
Leukocytes, UA: NEGATIVE
Nitrite, UA: NEGATIVE
Protein, UA: NEGATIVE
Spec Grav, UA: 1.015
Urobilinogen, UA: 0.2
pH, UA: 6

## 2014-07-25 LAB — HEMOGLOBIN A1C
Hgb A1c MFr Bld: 4.7 % (ref <5.7)
MEAN PLASMA GLUCOSE: 88 mg/dL (ref <117)

## 2014-07-25 LAB — TSH: TSH: 2.405 u[IU]/mL (ref 0.350–4.500)

## 2014-07-25 MED ORDER — KETOCONAZOLE 2 % EX SHAM: 1.0000 "application " | MEDICATED_SHAMPOO | CUTANEOUS | Status: DC

## 2014-07-25 MED ORDER — VALACYCLOVIR HCL 500 MG PO TABS: 500.0000 mg | ORAL_TABLET | Freq: Every day | ORAL | Status: DC

## 2014-07-25 MED ORDER — ENALAPRIL MALEATE 20 MG PO TABS: 20.0000 mg | ORAL_TABLET | Freq: Every day | ORAL | Status: DC

## 2014-07-25 MED ORDER — MELOXICAM 15 MG PO TABS: 15.0000 mg | ORAL_TABLET | Freq: Every day | ORAL | Status: DC | PRN

## 2014-07-25 MED ORDER — ATENOLOL 50 MG PO TABS: 50.0000 mg | ORAL_TABLET | Freq: Every day | ORAL | Status: DC

## 2014-07-25 MED ORDER — DESONIDE 0.05 % EX CREA: TOPICAL_CREAM | Freq: Two times a day (BID) | CUTANEOUS | Status: DC

## 2014-07-25 NOTE — Patient Instructions (Signed)

## 2014-07-25 NOTE — Progress Notes (Signed)
Subjective:    Patient ID: Julian Bates, male    DOB: 05-25-1966, 48 y.o.   MRN: [id number]  This chart was scribed for Wardell Honour, MD by Edison Simon, ED Scribe. This patient was seen in room 22 and the patient's care was started at 9:13 AM.   07/25/2014  Annual Exam and Hypertension   HPI  HPI Comments: Julian Bates is a 48 y.o. male who presents to the [facility] for 6 month follow up and Complete Physical Examination. His wife is translating. He states his last physical exam was more than 1 year ago. He states he has never had a colonoscopy and has not seen the dentist in some time.  Tetanus UTD.  Flu vaccine agreeable.  He states his father had CVAs and prostate cancer in his 58s.   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 He denies ever using cigarettes and reports some current alcohol use, but decreased compared to the past.   He reports a low count of HDL, 24, measured at work; he states he takes 2 fish oil tablets a day.  He reports recent eye problems including burning, itching, and eyelid problem; he states he saw an eye doctor and was prescribed eye glasses that he does not wear regularly. He reports washing his eyelids and using eyedrops.   He states his blood pressure readings have been good recently. He reports family history of diabetes. He denies headaches, dizziness, hearing problems, chest pain, cough, numbness, tingling, leg swelling, decreased urine stream, problems with erection. He reports dandruff, occasional palpitations, some heartburn, and apnea. He states he has never had a sleep study. He reports problems staying asleep and sometimes having trouble getting to sleep; he states he usually goes to bed at 1:30AM. He denies emotional problems. He states he has a bowel movement everyday; he reports some blood in stool due to hemorrhoids.    Having  recurrent issues with dandruff.  Using shampoo twice weekly.  Out of topical steroid cream to apply along hairline.   Review of Systems  Constitutional: Negative for fever, chills, diaphoresis, activity change, appetite change, fatigue and unexpected weight change.  HENT: Negative for congestion, dental problem, drooling, ear discharge, ear pain, facial swelling, hearing loss, mouth sores, nosebleeds, postnasal drip, rhinorrhea, sinus pressure, sneezing, sore throat, tinnitus, trouble swallowing and voice change.   Eyes: Positive for pain, redness and itching. Negative for photophobia, discharge and visual disturbance.  Respiratory: Positive for apnea. Negative for cough, choking, chest tightness, shortness of breath, wheezing and stridor.   Cardiovascular: Positive for palpitations. Negative for chest pain and leg swelling.  Gastrointestinal: Positive for blood in stool. Negative for nausea, vomiting, abdominal pain, diarrhea and constipation.       Positive heartburn  Endocrine: Negative for cold intolerance, heat intolerance, polydipsia, polyphagia and polyuria.  Genitourinary: Negative for dysuria, urgency, frequency, hematuria, flank pain, decreased urine volume, discharge, penile swelling, scrotal swelling, enuresis, difficulty urinating, genital sores, penile pain and testicular pain.       Denies problem keeping erection  Musculoskeletal: Negative for arthralgias, back pain, gait problem, joint swelling, myalgias, neck pain and neck stiffness.  Skin: Positive for color change and rash. Negative for pallor and wound.       dandruff  Allergic/Immunologic: Negative for environmental allergies, food allergies and immunocompromised state.  Neurological: Negative for dizziness, tremors, seizures, syncope, facial  asymmetry, speech difficulty, weakness, light-headedness, numbness and headaches.  Hematological: Negative for adenopathy. Does not bruise/bleed easily.  Psychiatric/Behavioral: Positive  for sleep disturbance. Negative for suicidal ideas, hallucinations, behavioral problems, confusion, self-injury, dysphoric mood, decreased concentration and agitation. The patient is not nervous/anxious and is not hyperactive.     Past Medical History  Diagnosis Date  . Hypertension    Past Surgical History  Procedure Laterality Date  . Hernia repair      x 3   Allergies  Allergen Reactions  . Tramadol    Current Outpatient Prescriptions  Medication Sig Dispense Refill  . atenolol (TENORMIN) 50 MG tablet Take 1 tablet (50 mg total) by mouth daily.  90 tablet  3  . enalapril (VASOTEC) 20 MG tablet Take 1 tablet (20 mg total) by mouth daily.  90 tablet  3  . hydrocortisone (ANUSOL-HC) 25 MG suppository Place 1 suppository (25 mg total) rectally 2 (two) times daily.  24 suppository  0  . desonide (DESOWEN) 0.05 % cream Apply topically 2 (two) times daily.  30 g  3  . ketoconazole (NIZORAL) 2 % shampoo Apply 1 application topically 2 (two) times a week.  120 mL  11  . meloxicam (MOBIC) 15 MG tablet Take 1 tablet (15 mg total) by mouth daily as needed for pain.  90 tablet  0  . valACYclovir (VALTREX) 500 MG tablet Take 1 tablet (500 mg total) by mouth daily.  90 tablet  3   No current facility-administered medications for this visit.   History   Social History  . Marital Status: Married    Spouse Name: N/A    Number of Children: N/A  . Years of Education: N/A   Occupational History  . machine operator    Social History Main Topics  . Smoking status: Never Smoker   . Smokeless tobacco: Never Used  . Alcohol Use: Yes     Comment: rarely drinks due to Red blood cell being low  . Drug Use: No  . Sexual Activity: Yes   Other Topics Concern  . Not on file   Social History Narrative   Marital status: married x 15 years.  From [place]; moved to [place] in 2008.      Children:  4 children; 1 stepchild.  Six grandchildren.      Lives:  With wife, dog.      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  Moderately happy.      Tobacco: none      Alcohol: drinks 2 beers on weekends; previous heavier use daily.        Drugs: none       Exercise:  None       Seatbelt:  100% of time.      Guns:     Family History  Problem Relation Age of Onset  . Arthritis Mother   . Diabetes Mother   . Hypertension Mother   . Arthritis Father   . Cancer Father     prostate  . Stroke Father     CVA x 5  . Hypertension Father   . Diabetes Father     with leg ampuation  . Diabetes Brother   . HIV Brother        Objective:    BP 120/76  Pulse 71  Temp(Src) 97.5 F (36.4 C) (Oral)  Resp 16  Ht $R'5\' 6"'wu$  (1.676 m)  Wt 155 lb 3.2 oz (70.398 kg)  BMI 25.06  kg/m2  SpO2 98% Physical Exam  Nursing note and vitals reviewed. Constitutional: He is oriented to person, place, and time. He appears well-developed and well-nourished. No distress.  HENT:  Head: Normocephalic and atraumatic.  Right Ear: External ear normal.  Left Ear: External ear normal.  Nose: Nose normal.  Mouth/Throat: Oropharynx is clear and moist.  TMs normal  Eyes: Conjunctivae and EOM are normal. Pupils are equal, round, and reactive to light.  Neck: Normal range of motion. Neck supple. Carotid bruit is not present. No thyromegaly present.  Cardiovascular: Normal rate, regular rhythm, normal heart sounds and intact distal pulses.  Exam reveals no gallop and no friction rub.   No murmur heard. Pulmonary/Chest: Effort normal and breath sounds normal. No respiratory distress. He has no wheezes. He has no rales.  Abdominal: Soft. Bowel sounds are normal. He exhibits no distension and no mass. There is no tenderness. There is no rebound and no guarding. Hernia confirmed negative in the right inguinal area and confirmed negative in the left inguinal area.  Genitourinary: Rectum normal, prostate normal, testes normal and penis normal. Prostate is not enlarged and not tender. Right testis shows no  mass, no swelling and no tenderness. Left testis shows no mass, no swelling and no tenderness. Circumcised.  Musculoskeletal: Normal range of motion.       Right shoulder: Normal.       Left shoulder: Normal.       Cervical back: Normal.  Lymphadenopathy:    He has no cervical adenopathy.       Right: No inguinal adenopathy present.       Left: No inguinal adenopathy present.  Neurological: He is alert and oriented to person, place, and time. He has normal reflexes. No cranial nerve deficit. He exhibits normal muscle tone. Coordination normal.  Skin: Skin is warm and dry. No rash noted. He is not diaphoretic. There is erythema.  Erythematous rash along hairline.  Psychiatric: He has a normal mood and affect. His behavior is normal. Judgment and thought content normal.   Results for orders placed in visit on 07/25/14  CBC WITH DIFFERENTIAL      Result Value Ref Range   WBC 7.3  4.0 - 10.5 K/uL   RBC 5.08  4.22 - 5.81 MIL/uL   Hemoglobin 16.3  13.0 - 17.0 g/dL   HCT 46.8  39.0 - 52.0 %   MCV 92.1  78.0 - 100.0 fL   MCH 32.1  26.0 - 34.0 pg   MCHC 34.8  30.0 - 36.0 g/dL   RDW 13.3  11.5 - 15.5 %   Platelets 134 (*) 150 - 400 K/uL   Neutrophils Relative % 74  43 - 77 %   Neutro Abs 5.4  1.7 - 7.7 K/uL   Lymphocytes Relative 19  12 - 46 %   Lymphs Abs 1.4  0.7 - 4.0 K/uL   Monocytes Relative 6  3 - 12 %   Monocytes Absolute 0.4  0.1 - 1.0 K/uL   Eosinophils Relative 1  0 - 5 %   Eosinophils Absolute 0.1  0.0 - 0.7 K/uL   Basophils Relative 0  0 - 1 %   Basophils Absolute 0.0  0.0 - 0.1 K/uL   Smear Review Criteria for review not met    COMPLETE METABOLIC PANEL WITH GFR      Result Value Ref Range   Sodium 138  135 - 145 mEq/L   Potassium 3.7  3.5 - 5.3 mEq/L  Chloride 102  96 - 112 mEq/L   CO2 28  19 - 32 mEq/L   Glucose, Bld 99  70 - 99 mg/dL   BUN 16  6 - 23 mg/dL   Creat 0.78  0.50 - 1.35 mg/dL   Total Bilirubin 1.0  0.2 - 1.2 mg/dL   Alkaline Phosphatase 58  39 - 117  U/L   AST 19  0 - 37 U/L   ALT 37  0 - 53 U/L   Total Protein 7.0  6.0 - 8.3 g/dL   Albumin 4.6  3.5 - 5.2 g/dL   Calcium 9.3  8.4 - 10.5 mg/dL   GFR, Est African American >89     GFR, Est Non African American >89    LIPID PANEL      Result Value Ref Range   Cholesterol 138  0 - 200 mg/dL   Triglycerides 259 (*) <150 mg/dL   HDL 30 (*) >39 mg/dL   Total CHOL/HDL Ratio 4.6     VLDL 52 (*) 0 - 40 mg/dL   LDL Cholesterol 56  0 - 99 mg/dL  HEMOGLOBIN A1C      Result Value Ref Range   Hemoglobin A1C 4.7  <5.7 %   Mean Plasma Glucose 88  <117 mg/dL  TSH      Result Value Ref Range   TSH 2.405  0.350 - 4.500 uIU/mL  PSA      Result Value Ref Range   PSA 0.36  <=4.00 ng/mL  POCT URINALYSIS DIPSTICK      Result Value Ref Range   Color, UA yellow     Clarity, UA clear     Glucose, UA neg     Bilirubin, UA neg     Ketones, UA neg     Spec Grav, UA 1.015     Blood, UA neg     pH, UA 6.0     Protein, UA neg     Urobilinogen, UA 0.2     Nitrite, UA neg     Leukocytes, UA Negative     INFLUENZA VACCINE ADMINISTERED.    Assessment & Plan:   1. Routine general medical examination at a health care facility   2. Essential hypertension, benign   3. Screening for prostate cancer   4. Need for prophylactic vaccination and inoculation against influenza   5. Seborrhea capitis   6. Seborrhea   7. Allergic conjunctivitis, bilateral   8. Snoring   9. Non-restorative sleep     1. Complete Physical Examination: anticipatory guidance --- exercise, limiting alcohol intake, ASA $RemoveBef'81mg'lEMVJaOItJ$  daily.  S/p flu vaccine.   2.  HTN: controlled; obtain labs, u/a, EKG.  Refill provided; follow-up six months. 3.  Screening prostate cancer: DRE and PSA obtained and normal. 4.  Seborrhea dermatitis: persistent; refill of Nizoral shampoo and desonide provided. 5.  Allergic Conjunctivitis: uncontrolled; send in rx for pataday. 6.  Snoring/apnea/non-restorative sleep: New. Refer for sleep study. 7. OA knees  B:  Persistent; rx for Mobic to use PRN knee pain. 8. S/p flu vaccine.   Meds ordered this encounter  Medications  . desonide (DESOWEN) 0.05 % cream    Sig: Apply topically 2 (two) times daily.    Dispense:  30 g    Refill:  3  . ketoconazole (NIZORAL) 2 % shampoo    Sig: Apply 1 application topically 2 (two) times a week.    Dispense:  120 mL    Refill:  11  .  enalapril (VASOTEC) 20 MG tablet    Sig: Take 1 tablet (20 mg total) by mouth daily.    Dispense:  90 tablet    Refill:  3  . atenolol (TENORMIN) 50 MG tablet    Sig: Take 1 tablet (50 mg total) by mouth daily.    Dispense:  90 tablet    Refill:  3  . valACYclovir (VALTREX) 500 MG tablet    Sig: Take 1 tablet (500 mg total) by mouth daily.    Dispense:  90 tablet    Refill:  3  . meloxicam (MOBIC) 15 MG tablet    Sig: Take 1 tablet (15 mg total) by mouth daily as needed for pain.    Dispense:  90 tablet    Refill:  0    Return in about 6 months (around 01/23/2015) for recheck high blood pressure.   I personally performed the services described in this documentation, which was scribed in my presence.  The recorded information has been reviewed and is accurate.   Reginia Forts, M.D.  [facility] [address] [place], [place]  [address] [phone] phone [phone] fax

## 2014-07-26 LAB — PSA: PSA: 0.36 ng/mL (ref <=4.00)

## 2014-07-27 ENCOUNTER — Encounter: Payer: Self-pay | Admitting: Family Medicine

## 2014-07-27 DIAGNOSIS — L210 Seborrhea capitis: Secondary | ICD-10-CM | POA: Insufficient documentation

## 2014-07-27 DIAGNOSIS — L219 Seborrheic dermatitis, unspecified: Secondary | ICD-10-CM | POA: Insufficient documentation

## 2014-07-27 DIAGNOSIS — H1010 Acute atopic conjunctivitis, unspecified eye: Secondary | ICD-10-CM | POA: Insufficient documentation

## 2014-07-27 MED ORDER — OLOPATADINE HCL 0.2 % OP SOLN: 1.0000 [drp] | Freq: Every day | OPHTHALMIC | Status: DC

## 2014-12-16 ENCOUNTER — Other Ambulatory Visit: Payer: Self-pay | Admitting: Family Medicine

## 2015-01-23 ENCOUNTER — Ambulatory Visit: Payer: Managed Care, Other (non HMO) | Admitting: Family Medicine

## 2015-02-27 ENCOUNTER — Encounter: Payer: Self-pay | Admitting: Family Medicine

## 2015-02-27 ENCOUNTER — Ambulatory Visit (INDEPENDENT_AMBULATORY_CARE_PROVIDER_SITE_OTHER): Payer: Managed Care, Other (non HMO) | Admitting: Family Medicine

## 2015-02-27 VITALS — BP 130/86 | HR 74 | Temp 98.2°F | Resp 16 | Ht 66.0 in | Wt 161.8 lb

## 2015-02-27 DIAGNOSIS — K625 Hemorrhage of anus and rectum: Secondary | ICD-10-CM | POA: Diagnosis not present

## 2015-02-27 DIAGNOSIS — D696 Thrombocytopenia, unspecified: Secondary | ICD-10-CM | POA: Diagnosis not present

## 2015-02-27 DIAGNOSIS — K644 Residual hemorrhoidal skin tags: Secondary | ICD-10-CM

## 2015-02-27 DIAGNOSIS — E785 Hyperlipidemia, unspecified: Secondary | ICD-10-CM | POA: Diagnosis not present

## 2015-02-27 DIAGNOSIS — I10 Essential (primary) hypertension: Secondary | ICD-10-CM

## 2015-02-27 DIAGNOSIS — F411 Generalized anxiety disorder: Secondary | ICD-10-CM

## 2015-02-27 DIAGNOSIS — K648 Other hemorrhoids: Secondary | ICD-10-CM | POA: Diagnosis not present

## 2015-02-27 LAB — COMPREHENSIVE METABOLIC PANEL
ALT: 48 U/L (ref 0–53)
AST: 23 U/L (ref 0–37)
Albumin: 4.5 g/dL (ref 3.5–5.2)
Alkaline Phosphatase: 57 U/L (ref 39–117)
BUN: 14 mg/dL (ref 6–23)
CHLORIDE: 103 meq/L (ref 96–112)
CO2: 28 mEq/L (ref 19–32)
CREATININE: 0.86 mg/dL (ref 0.50–1.35)
Calcium: 9 mg/dL (ref 8.4–10.5)
GLUCOSE: 98 mg/dL (ref 70–99)
Potassium: 4.1 mEq/L (ref 3.5–5.3)
SODIUM: 140 meq/L (ref 135–145)
TOTAL PROTEIN: 7 g/dL (ref 6.0–8.3)
Total Bilirubin: 0.9 mg/dL (ref 0.2–1.2)

## 2015-02-27 LAB — CBC WITH DIFFERENTIAL/PLATELET
BASOS PCT: 1 % (ref 0–1)
Basophils Absolute: 0.1 10*3/uL (ref 0.0–0.1)
EOS ABS: 0.1 10*3/uL (ref 0.0–0.7)
EOS PCT: 2 % (ref 0–5)
HCT: 46.1 % (ref 39.0–52.0)
Hemoglobin: 16.3 g/dL (ref 13.0–17.0)
LYMPHS PCT: 22 % (ref 12–46)
Lymphs Abs: 1.4 10*3/uL (ref 0.7–4.0)
MCH: 31.9 pg (ref 26.0–34.0)
MCHC: 35.4 g/dL (ref 30.0–36.0)
MCV: 90.2 fL (ref 78.0–100.0)
MONO ABS: 0.4 10*3/uL (ref 0.1–1.0)
MPV: 9.8 fL (ref 8.6–12.4)
Monocytes Relative: 7 % (ref 3–12)
Neutro Abs: 4.4 10*3/uL (ref 1.7–7.7)
Neutrophils Relative %: 68 % (ref 43–77)
Platelets: 140 10*3/uL — ABNORMAL LOW (ref 150–400)
RBC: 5.11 MIL/uL (ref 4.22–5.81)
RDW: 13.6 % (ref 11.5–15.5)
WBC: 6.4 10*3/uL (ref 4.0–10.5)

## 2015-02-27 LAB — LIPID PANEL
CHOL/HDL RATIO: 5.1 ratio
CHOLESTEROL: 139 mg/dL (ref 0–200)
HDL: 27 mg/dL — AB (ref >=40)
LDL Cholesterol: 65 mg/dL (ref 0–99)
TRIGLYCERIDES: 236 mg/dL — AB (ref <150)
VLDL: 47 mg/dL — AB (ref 0–40)

## 2015-02-27 MED ORDER — ATENOLOL 50 MG PO TABS: 50.0000 mg | ORAL_TABLET | Freq: Every day | ORAL | Status: DC

## 2015-02-27 MED ORDER — ENALAPRIL MALEATE 20 MG PO TABS: 20.0000 mg | ORAL_TABLET | Freq: Every day | ORAL | Status: DC

## 2015-02-27 MED ORDER — CITALOPRAM HYDROBROMIDE 20 MG PO TABS: 20.0000 mg | ORAL_TABLET | Freq: Every day | ORAL | Status: DC

## 2015-02-27 NOTE — Progress Notes (Signed)
Subjective:    Patient ID: Julian Bates, male    DOB: [birth year], 50 y.o.   MRN: [id number]  HPI  Julian Bates is a 49 year old male who presents today for follow-up of hypertension.  He has been well-controlled on his current regimen of Atenolol and Enalapril daily. He does not check his blood pressure regularly, but can tell if he misses a dose of medications because he will get a headache and become angry. Because of this, he takes his medications regularly. Denies any headache, chest pain, shortness of breath, leg swelling, palpitations, numbness, or tingling in extremities. He does not exercise.  One month ago he experienced an episode of dizziness. He woke up in the morning with dizziness and thought it would go away throughout the day, but it never did. He felt like he could vomit, but did not. Denies syncopal episode. Went to the Emergency Department and they diagnosed him with vertigo. Gave him Meclizine for dizziness which he took for two days, but it made him drowsy and unable to work and operate the machinery at work, so he stopped taking it. Occasionally has dizziness if he stands up too quickly or moves his head from side to side too fast. Has not had a syncopal episode or has not had a dizziness episode as severe. Denies dizziness or headache today. Denies associated blurred vision or diplopia, hearing loss, tinnitus, focal weakness, or paresthesias.  No labs or CT head performed in ED.  He notes that he has been anxious lately. Says this is chronic, but has been worsening lately. 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 His wife is present with him today, and also says that he is always worried about everything, especially things that are out of his control. Wife said that he worries about their finances and what will happen if they lose everything. He says that when he starts feeling anxious, his heart starts racing. Denies  palpitations at this moment. Denies sleep disturbance. Would consider starting a medication to help him relax.   Still complains of blood in his stool. External hemorrhoids are still a concern. He feels constipated occasionally, and will have to strain to have a bowel movement. Denies pain or itching. Has not been taking the suppositories regularly. Has had this issue in the past, but has not had a colonoscopy for further evaluation.   Review of Systems  Constitutional: Negative for fever, chills, diaphoresis, activity change, appetite change and fatigue.  HENT: Positive for congestion and ear pain (Left ear pain). Negative for postnasal drip, rhinorrhea, sinus pressure, sneezing and sore throat.   Eyes: Positive for itching. Negative for pain.  Respiratory: Negative for cough, chest tightness and shortness of breath.   Cardiovascular: Positive for palpitations (Occasionally when anxious). Negative for chest pain and leg swelling.  Gastrointestinal: Positive for constipation (Occasional constipation and straining for bowel movement), blood in stool and anal bleeding. Negative for nausea, vomiting, abdominal pain, diarrhea and rectal pain.  Endocrine: Negative for cold intolerance, heat intolerance, polydipsia, polyphagia and polyuria.  Genitourinary: Negative for dysuria, frequency and hematuria.  Skin: Negative for color change, rash and wound.  Neurological: Negative for dizziness, tremors, seizures, syncope, facial asymmetry, speech difficulty, weakness, light-headedness, numbness and headaches.  Psychiatric/Behavioral: Negative for suicidal ideas, sleep disturbance, self-injury and dysphoric mood. The patient is nervous/anxious.    Past Medical History  Diagnosis Date  . Hypertension   . Seborrhea   .  Allergic conjunctivitis   . Arthritis    Past Surgical History  Procedure Laterality Date  . Hernia repair      x 3   Allergies  Allergen Reactions  . Tramadol Nausea Only   History    Social History  . Marital Status: Married    Spouse Name: N/A  . Number of Children: N/A  . Years of Education: N/A   Occupational History  . machine operator    Social History Main Topics  . Smoking status: Never Smoker   . Smokeless tobacco: Never Used  . Alcohol Use: Yes     Comment: rarely drinks due to Red blood cell being low  . Drug Use: No  . Sexual Activity: Yes   Other Topics Concern  . Not on file   Social History Narrative   Marital status: married x 15 years.  From [place]; moved to [place] in 2008.      Children:  4 children; 1 stepchild.  Six grandchildren.      Lives:  With wife, dog.      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  Moderately happy.      Tobacco: none      Alcohol: drinks 2 beers on weekends; previous heavier use daily.        Drugs: none       Exercise:  None       Seatbelt:  100% of time.      Guns:         Objective:   Physical Exam  Constitutional: He is oriented to person, place, and time. He appears well-developed and well-nourished. No distress.  BP 130/86 mmHg  Pulse 74  Temp(Src) 98.2 F (36.8 C) (Oral)  Resp 16  Ht  (1.676 m)  Wt 161 lb 12.8 oz (73.392 kg)  BMI 26.13 kg/m2  SpO2 100%  HENT:  Head: Normocephalic and atraumatic.  Right Ear: Tympanic membrane, external ear and ear canal normal.  Left Ear: There is tenderness.  Nose: Nose normal.  Mouth/Throat: Uvula is midline, oropharynx is clear and moist and mucous membranes are normal. No oropharyngeal exudate, posterior oropharyngeal edema or posterior oropharyngeal erythema.  Scratch/abrasion superficial anterior wall in the left ear canal that is painful when otoscope tip is inserted.  Eyes: EOM are normal. Pupils are equal, round, and reactive to light. Right conjunctiva is injected. Left conjunctiva is injected. No scleral icterus.  Mild injection of conjunctiva.  Neck: Normal range of motion. Neck supple. Carotid bruit is not present.  No thyromegaly present.  Cardiovascular: Normal rate, regular rhythm, normal heart sounds and intact distal pulses.  Exam reveals no gallop and no friction rub.   No murmur heard. Pulmonary/Chest: Effort normal and breath sounds normal. He has no wheezes. He has no rales.  Abdominal: Soft. Bowel sounds are normal. He exhibits no distension and no mass. There is no tenderness. There is no rebound and no guarding.  Genitourinary: Rectal exam shows external hemorrhoid.     Musculoskeletal: Normal range of motion. He exhibits no edema.  Lymphadenopathy:    He has no cervical adenopathy.  Neurological: He is alert and oriented to person, place, and time. No cranial nerve deficit.  Skin: Skin is warm and dry. No rash noted. He is not diaphoretic. No erythema.  Psychiatric: His behavior is normal. His mood appears anxious.  Nursing note and vitals reviewed.     Assessment & Plan:  1. Essential hypertension, benign Stable/controlled. Continue  current medication regimen. Continue to check blood pressure weekly and keep a log of blood pressure readings.  - Comprehensive metabolic panel - Lipid panel - atenolol (TENORMIN) 50 MG tablet; Take 1 tablet (50 mg total) by mouth daily.  Dispense: 90 tablet; Refill: 3 - enalapril (VASOTEC) 20 MG tablet; Take 1 tablet (20 mg total) by mouth daily.  Dispense: 90 tablet; Refill: 3  2. Dyslipidemia -Stable. Recheck from 6 months ago. Recommend weight loss, exercise, dietary modification. - Comprehensive metabolic panel - Lipid panel  3. Thrombocytopenia -Stable/chronic. Recheck from 6 months ago. Asymptomatic.  - CBC with Differential/Platelet  4. Rectal bleeding, external hemorrhoid -Worsening.  External hemorrhoids present with blood in stool x 1 week.  - Ambulatory referral to Gastroenterology. Colonoscopy due August 2016, may schedule colonoscopy now due to rectal bleeding. - Discussed the importance of taking the suppository twice daily for two  weeks until the bleeding has stopped.  6. Anxiety state -New.  Patient feels anxious constantly. - citalopram (CELEXA) 20 MG tablet; Take 1 tablet (20 mg total) by mouth daily.  Dispense: 90 tablet; Refill: 1 - Discussed with patient that the effects may take 4-6 weeks before he notices a difference. He needs to take the medication even if he does not feel anxious.    Quantasia Stegner Paulita FujitaMartin Sharlie Shreffler, M.D. [facility]  [place] [address] [place], [place]  [id number] [phone] phone [phone] fax

## 2015-02-27 NOTE — Patient Instructions (Signed)
Trastorno de ansiedad generalizada (Generalized Anxiety Disorder) El trastorno de ansiedad generalizada es un trastorno mental. Interfiere en las funciones vitales, incluyendo las relaciones, el trabajo y la escuela.  Es diferente de la ansiedad normal que todas las personas experimentan en algn momento de su vida en respuesta a sucesos y actividades especficas. En verdad, la ansiedad normal nos ayuda a prepararnos y atravesar estos acontecimientos y actividades de la vida. La ansiedad normal desaparece despus de que el evento o la actividad ha finalizado.  El trastorno de ansiedad generalizada no est necesariamente relacionada con eventos o actividades especficas. Tambin causa un exceso de ansiedad en proporcin a sucesos o actividades especficas. En este trastorno la ansiedad es difcil de controlar. Los sntomas pueden variar de leves a muy graves. Las personas que sufren de trastorno de ansiedad generalizada pueden tener intensas olas de ansiedad con sntomas fsicos (ataques de pnico).  SNTOMAS  La ansiedad y la preocupacin asociada a este trastorno son difciles de controlar. Esta ansiedad y la preocupacin estn relacionados con muchos eventos de la vida y sus actividades y tambin ocurre durante ms das de los que no ocurre, durante 6 meses o ms. Las personas que la sufren pueden tener tres o ms de los siguientes sntomas (uno o ms en los nios):   Agitacin   Fatiga.  Dificultades de concentracin.   Irritabilidad.  Tensin muscular  Dificultad para dormirse o sueo poco satisfactorio. DIAGNSTICO  Se diagnostica a travs de una evaluacin realizada por el mdico. El mdico le har preguntas acerca de su estado de nimo, sntomas fsicos y sucesos de su vida. Le har preguntas sobre su historia clnica, el consumo de alcohol o drogas, incluyendo los medicamentos recetados. Tambin le har un examen fsico e indicar anlisis de sangre. Ciertas enfermedades y el uso de  determinadas sustancias pueden causar sntomas similares a este trastorno. Su mdico lo puede derivar a un especialista en salud mental para una evaluacin ms profunda..  TRATAMIENTO  Las terapias siguientes se utilizan en el tratamiento de este trastorno:   Medicamentos - Se recetan antidepresivos para el control diario a largo plazo. Pueden indicarse tambin medicamentos para combatir la ansiedad en los casos graves, especialmente cuando ocurren ataques de pnico.   Terapia conversada (psicoterapia) Ciertos tipos de psicoterapia pueden ser tiles en el tratamiento del trastorno de ansiedad generalizada, proporcionando apoyo, educacin y orientacin. Una forma de psicoterapia llamada terapia cognitivo-conductual puede ensearle formas saludables de pensar y reaccionar a los eventos y actividades de la vida diaria.  Tcnicasde manejo del estrs- Estas tcnicas incluyen el yoga, la meditacin y el ejercicio y pueden ser muy tiles cuando se practican con regularidad. Un especialista en salud mental puede ayudar a determinar qu tratamiento es mejor para usted. Algunas personas obtienen mejora con una terapia. Sin embargo, otras personas requieren una combinacin de terapias.  Document Released: 02/20/2013 Document Revised: 03/12/2014 ExitCare Patient Information 2015 [employer]. This information is not intended to replace advice given to you by your health care provider. Make sure you discuss any questions you have with your health care provider.  

## 2015-03-01 ENCOUNTER — Encounter: Payer: Self-pay | Admitting: Internal Medicine

## 2015-05-09 ENCOUNTER — Encounter: Payer: Self-pay | Admitting: Internal Medicine

## 2015-05-09 ENCOUNTER — Ambulatory Visit (INDEPENDENT_AMBULATORY_CARE_PROVIDER_SITE_OTHER): Payer: Managed Care, Other (non HMO) | Admitting: Internal Medicine

## 2015-05-09 VITALS — BP 112/68 | HR 70 | Ht 67.0 in | Wt 155.0 lb

## 2015-05-09 DIAGNOSIS — Z8719 Personal history of other diseases of the digestive system: Secondary | ICD-10-CM | POA: Diagnosis not present

## 2015-05-09 DIAGNOSIS — D696 Thrombocytopenia, unspecified: Secondary | ICD-10-CM | POA: Insufficient documentation

## 2015-05-09 DIAGNOSIS — Z1211 Encounter for screening for malignant neoplasm of colon: Secondary | ICD-10-CM | POA: Diagnosis not present

## 2015-05-09 DIAGNOSIS — K648 Other hemorrhoids: Secondary | ICD-10-CM | POA: Diagnosis not present

## 2015-05-09 NOTE — Progress Notes (Signed)
Referred by: [name], MD  Subjective:    Patient ID: Julian Bates, male    DOB: 03-12-1966, 49 y.o.   MRN: [id number] Cc: rectal bleeding HPI The patient is a very nice [place] man here with his wife and an interpreter because of rectal bleeding. This is been a problem for the last year. It seems painless it's intermittent. He does have constipation with  straining to stool and spends more than 5 minutes on the toilet. Protrusion with symptoms of hemorrhoidal prolapse as well but spontaneously reduces. Hydrocortisone suppositories and alleviated the symptoms but using them twice a day it is problematic because during the daytime he is concerned that he will soiled his underwear. He does move his bowels daily. GI review of systems is otherwise negative. Does have a history of low-grade thrombocytopenia and may of have neutropenia in the past. It sounds like he saw a hematologist in [place] I don't have those records. Years ago he was drinking too much when he was a young man was told to reduce his alcohol when he was in [place] He has been screened for hepatitis and that is all negative. There is no known chronic liver disease. He will drink a beer maybe once a week now.  He does not really do significant heavy lifting or straining otherwise. He has never had any endoscopic evaluations.  Medications, allergies, past medical history, past surgical history, family history and social history are reviewed and updated in the EMR.  Review of Systems As per history of present illness. All other review of systems are negative at this time.    Objective:   Physical Exam  112/68 mmHg  Pulse 70  Ht  (1.702 m)  Wt 155 lb (70.308 kg)  BMI 24.27 kg/m2@  General:  Well-developed, well-nourished and in no acute distress Eyes:  anicteric. ENT:   Mouth and posterior pharynx free of lesions.  Neck:   supple w/o thyromegaly or mass.  Lungs: Clear to auscultation  bilaterally. Heart:  S1S2, no rubs, murmurs, gallops. Abdomen:  soft, non-tender, no hepatosplenomegaly, hernia, or mass and BS+.  Rectal:  NL anoderm No mass Nontender with normal prostate Formed brown stool  Anoscopy Gr 2 int hems all positions  Lymph:  no cervical or supraclavicular adenopathy. Extremities:   no edema, cyanosis or clubbing Skin   no rash. Neuro:  A&O x 3.  Psych:  appropriate mood and  Affect.   Data Reviewed: Lab Results  Component Value Date   WBC 6.4 02/27/2015   HGB 16.3 02/27/2015   HCT 46.1 02/27/2015   MCV 90.2 02/27/2015   PLT 140* 02/27/2015      Assessment & Plan:   1. Hemorrhoids, internal, with bleeding   2. Thrombocytopenia   3. History of liver disease   4. Special screening for malignant neoplasms, colon     For his hemorrhoids, he continues his hydrocortisone suppositories at bedtime for symptomatic relief.  For constipation and hemorrhoid start Benefiber 2 tablespoons daily. He is increased fiber in his diet but it hasn't helped. Reduce straining and time on the commode.  Screening colonoscopy will be scheduled, The risks and benefits as well as alternatives of endoscopic procedure(s) have been discussed and reviewed. All questions answered. The patient agrees to proceed.  He had this mild thrombocytopenia, I don't think he has any significant liver disease at all seen any stigmata on his exam etc. but an ultrasound is appropriate. Would not want to band rectal  varices from chronic liver disease. Colonoscopy well sort out as well.  I have explained the nature of hemorrhoidal banding in the risks benefits and indications to the patient. He is interested in pursuing this as long as there are contraindication such as chronic liver disease.    I appreciate the opportunity to care for this patient. CC: SMITH,KRISTI, MD

## 2015-05-09 NOTE — Patient Instructions (Addendum)
  You have been scheduled for a colonoscopy. Please follow written instructions given to you at your visit today.  Please pick up your prep supplies at the pharmacy within the next 1-3 days. If you use inhalers (even only as needed), please bring them with you on the day of your procedure.   Today you have been given a handout on benefiber to read and follow.   You have been scheduled for an abdominal ultrasound at [facility] on 05/14/15 at 10:30AM. Please arrive 30minutes prior to your appointment for registration. Make certain not to have anything to eat or drink 6 hours prior to your appointment. Should you need to reschedule your appointment, please contact radiology at [phone]. This test typically takes about 30 minutes to perform.   Use the rectal suppositories at night.   I appreciate the opportunity to care for you. Stan Headarl Gessner, MD, [facility]

## 2015-05-10 ENCOUNTER — Encounter: Payer: Self-pay | Admitting: Internal Medicine

## 2015-05-14 ENCOUNTER — Ambulatory Visit
Admission: RE | Admit: 2015-05-14 | Discharge: 2015-05-14 | Disposition: A | Discharge status: Home / Self Care | Payer: Managed Care, Other (non HMO) | Source: Ambulatory Visit | Attending: Internal Medicine | Admitting: Internal Medicine

## 2015-05-14 DIAGNOSIS — Z8719 Personal history of other diseases of the digestive system: Secondary | ICD-10-CM

## 2015-05-20 NOTE — Progress Notes (Signed)
Quick Note:  Liver looks like fatty liver - no cirrhosis Spleen on the large size Nothing bad here at this time Will explain more at colonoscopy ______

## 2015-05-20 NOTE — Progress Notes (Signed)
Quick Note:  need to tell wife or have Spanish- speaking person call ______

## 2015-06-05 ENCOUNTER — Ambulatory Visit: Payer: Managed Care, Other (non HMO) | Admitting: Family Medicine

## 2015-06-06 ENCOUNTER — Encounter: Payer: Self-pay | Admitting: Internal Medicine

## 2015-06-06 ENCOUNTER — Ambulatory Visit (AMBULATORY_SURGERY_CENTER): Payer: Managed Care, Other (non HMO) | Admitting: Internal Medicine

## 2015-06-06 VITALS — BP 116/79 | HR 56 | Temp 97.2°F | Resp 12 | Ht 67.0 in | Wt 155.0 lb

## 2015-06-06 DIAGNOSIS — Z1211 Encounter for screening for malignant neoplasm of colon: Secondary | ICD-10-CM | POA: Diagnosis present

## 2015-06-06 DIAGNOSIS — K760 Fatty (change of) liver, not elsewhere classified: Secondary | ICD-10-CM

## 2015-06-06 HISTORY — DX: Fatty (change of) liver, not elsewhere classified: K76.0

## 2015-06-06 MED ORDER — SODIUM CHLORIDE 0.9 % IV SOLN: 500.0000 mL | INTRAVENOUS | Status: DC

## 2015-06-06 NOTE — Op Note (Signed)
[facility] [address] [place], [address]   COLONOSCOPY PROCEDURE REPORT  PATIENT: Julian Bates, Julian Bates  MR#: [id number] BIRTHDATE: 12-23-1965 , 48  yrs. old GENDER: male ENDOSCOPIST: Iva Boop, MD, [facility] PROCEDURE DATE:  06/06/2015 PROCEDURE:   Colonoscopy, screening First Screening Colonoscopy - Avg.  risk and is 50 yrs.  old or older Yes.  Prior Negative Screening - Now for repeat screening. N/A  History of Adenoma - Now for follow-up colonoscopy & has been > or = to 3 yrs.  N/A  Polyps removed today? No Recommend repeat exam, <10 yrs? No ASA CLASS:   Class II INDICATIONS:Screening for colonic neoplasia and Colorectal Neoplasm Risk Assessment for this procedure is average risk. MEDICATIONS: Propofol 200 mg IV and Monitored anesthesia care  DESCRIPTION OF PROCEDURE:   After the risks benefits and alternatives of the procedure were thoroughly explained, informed consent was obtained.  The digital rectal exam revealed no abnormalities of the rectum, revealed no prostatic nodules, and revealed the prostate was not enlarged.   The LB [id number] endoscope was introduced through the anus and advanced to the cecum, which was identified by both the appendix and ileocecal valve. No adverse events experienced.   The quality of the prep was adequate (MiraLax was used)  The instrument was then slowly withdrawn as the colon was fully examined. Estimated blood loss is zero unless otherwise noted in this procedure report.      COLON FINDINGS: Internal hemorrhoids were found.   The examination was otherwise normal.  Retroflexed views revealed no abnormalities. The time to cecum = 2.7 Withdrawal time = 8.1   The scope was withdrawn and the procedure completed. COMPLICATIONS: There were no immediate complications.  ENDOSCOPIC IMPRESSION: 1.   Internal hemorrhoids 2.   The examination was otherwise normal - adequate prep -  first screening  RECOMMENDATIONS: 1.  Repeat colonoscopy 10 years. 2.  My office will arrange hemorrhoid banding appointment.  eSigned:  Iva Boop, MD, [facility] 06/06/2015 11:01 AM   cc: The Patient and Dr. Nilda Simmer

## 2015-06-06 NOTE — Progress Notes (Signed)
Report to PACU, RN, vss, BBS= Clear.  

## 2015-06-06 NOTE — Patient Instructions (Addendum)
No polyps or cancer. I saw the hemorrhoids.  We will contact you or your wife about an appointment to fix the hemorrhoids.  The liver ultrasound showed fatty liver. Please read the handout about that.  Next routine colonoscopy in 10 years - 2026  I appreciate the opportunity to care for you. Iva Boop, MD, FACG    YOU HAD AN ENDOSCOPIC PROCEDURE TODAY AT THE [facility]:   Refer to the procedure report that was given to you for any specific questions about what was found during the examination.  If the procedure report does not answer your questions, please call your gastroenterologist to clarify.  If you requested that your care partner not be given the details of your procedure findings, then the procedure report has been included in a sealed envelope for you to review at your convenience later.  YOU SHOULD EXPECT: Some feelings of bloating in the abdomen. Passage of more gas than usual.  Walking can help get rid of the air that was put into your GI tract during the procedure and reduce the bloating. If you had a lower endoscopy (such as a colonoscopy or flexible sigmoidoscopy) you may notice spotting of blood in your stool or on the toilet paper. If you underwent a bowel prep for your procedure, you may not have a normal bowel movement for a few days.  Please Note:  You might notice some irritation and congestion in your nose or some drainage.  This is from the oxygen used during your procedure.  There is no need for concern and it should clear up in a day or so.  SYMPTOMS TO REPORT IMMEDIATELY:   Following lower endoscopy (colonoscopy or flexible sigmoidoscopy):  Excessive amounts of blood in the stool  Significant tenderness or worsening of abdominal pains  Swelling of the abdomen that is new, acute  Fever of 100F or higher   For urgent or emergent issues, a gastroenterologist can be reached at any hour by calling [phone].   DIET: Your first  meal following the procedure should be a small meal and then it is ok to progress to your normal diet. Heavy or fried foods are harder to digest and may make you feel nauseous or bloated.  Likewise, meals heavy in dairy and vegetables can increase bloating.  Drink plenty of fluids but you should avoid alcoholic beverages for 24 hours.  ACTIVITY:  You should plan to take it easy for the rest of today and you should NOT DRIVE or use heavy machinery until tomorrow (because of the sedation medicines used during the test).    FOLLOW UP: Our staff will call the number listed on your records the next business day following your procedure to check on you and address any questions or concerns that you may have regarding the information given to you following your procedure. If we do not reach you, we will leave a message.  However, if you are feeling well and you are not experiencing any problems, there is no need to return our call.  We will assume that you have returned to your regular daily activities without incident.  If any biopsies were taken you will be contacted by phone or by letter within the next 1-3 weeks.  Please call us at [phone] if you have not heard about the biopsies in 3 weeks.    SIGNATURES/CONFIDENTIALITY: You and/or your care partner have signed paperwork which will be entered into your electronic medical record.  These signatures attest to the fact that that the information above on your After Visit Summary has been reviewed and is understood.  Full responsibility of the confidentiality of this discharge information lies with you and/or your care-partner.  YOU HAD AN ENDOSCOPIC PROCEDURE TODAY AT THE [facility]:   Refer to the procedure report that was given to you for any specific questions about what was found during the examination.  If the procedure report does not answer your questions, please call your gastroenterologist to clarify.  If you requested that your  care partner not be given the details of your procedure findings, then the procedure report has been included in a sealed envelope for you to review at your convenience later.  YOU SHOULD EXPECT: Some feelings of bloating in the abdomen. Passage of more gas than usual.  Walking can help get rid of the air that was put into your GI tract during the procedure and reduce the bloating. If you had a lower endoscopy (such as a colonoscopy or flexible sigmoidoscopy) you may notice spotting of blood in your stool or on the toilet paper. If you underwent a bowel prep for your procedure, you may not have a normal bowel movement for a few days.  Please Note:  You might notice some irritation and congestion in your nose or some drainage.  This is from the oxygen used during your procedure.  There is no need for concern and it should clear up in a day or so.  SYMPTOMS TO REPORT IMMEDIATELY:   Following lower endoscopy (colonoscopy or flexible sigmoidoscopy):  Excessive amounts of blood in the stool  Significant tenderness or worsening of abdominal pains  Swelling of the abdomen that is new, acute  Fever of 100F or higher  YOU HAD AN ENDOSCOPIC PROCEDURE TODAY AT THE [facility]:   Refer to the procedure report that was given to you for any specific questions about what was found during the examination.  If the procedure report does not answer your questions, please call your gastroenterologist to clarify.  If you requested that your care partner not be given the details of your procedure findings, then the procedure report has been included in a sealed envelope for you to review at your convenience later.  YOU SHOULD EXPECT: Some feelings of bloating in the abdomen. Passage of more gas than usual.  Walking can help get rid of the air that was put into your GI tract during the procedure and reduce the bloating. If you had a lower endoscopy (such as a colonoscopy or flexible sigmoidoscopy) you may  notice spotting of blood in your stool or on the toilet paper. If you underwent a bowel prep for your procedure, you may not have a normal bowel movement for a few days.  Please Note:  You might notice some irritation and congestion in your nose or some drainage.  This is from the oxygen used during your procedure.  There is no need for concern and it should clear up in a day or so.  SYMPTOMS TO REPORT IMMEDIATELY:   Following lower endoscopy (colonoscopy or flexible sigmoidoscopy):  Excessive amounts of blood in the stool  Significant tenderness or worsening of abdominal pains  Swelling of the abdomen that is new, acute  Fever of 100F or higher   For urgent or emergent issues, a gastroenterologist can be reached at any hour by calling [phone].   DIET: Your first meal following the procedure should be a small  meal and then it is ok to progress to your normal diet. Heavy or fried foods are harder to digest and may make you feel nauseous or bloated.  Likewise, meals heavy in dairy and vegetables can increase bloating.  Drink plenty of fluids but you should avoid alcoholic beverages for 24 hours.  ACTIVITY:  You should plan to take it easy for the rest of today and you should NOT DRIVE or use heavy machinery until tomorrow (because of the sedation medicines used during the test).    FOLLOW UP: Our staff will call the number listed on your records the next business day following your procedure to check on you and address any questions or concerns that you may have regarding the information given to you following your procedure. If we do not reach you, we will leave a message.  However, if you are feeling well and you are not experiencing any problems, there is no need to return our call.  We will assume that you have returned to your regular daily activities without incident.  If any biopsies were taken you will be contacted by phone or by letter within the next 1-3 weeks.  Please call us  at [phone] if you have not heard about the biopsies in 3 weeks.    SIGNATURES/CONFIDENTIALITY: You and/or your care partner have signed paperwork which will be entered into your electronic medical record.  These signatures attest to the fact that that the information above on your After Visit Summary has been reviewed and is understood.  Full responsibility of the confidentiality of this discharge information lies with you and/or your care-partner. For urgent or emergent issues, a gastroenterologist can be reached at any hour by calling [phone].   DIET: Your first meal following the procedure should be a small meal and then it is ok to progress to your normal diet. Heavy or fried foods are harder to digest and may make you feel nauseous or bloated.  Likewise, meals heavy in dairy and vegetables can increase bloating.  Drink plenty of fluids but you should avoid alcoholic beverages for 24 hours.  ACTIVITY:  You should plan to take it easy for the rest of today and you should NOT DRIVE or use heavy machinery until tomorrow (because of the sedation medicines used during the test).    FOLLOW UP: Our staff will call the number listed on your records the next business day following your procedure to check on you and address any questions or concerns that you may have regarding the information given to you following your procedure. If we do not reach you, we will leave a message.  However, if you are feeling well and you are not experiencing any problems, there is no need to return our call.  We will assume that you have returned to your regular daily activities without incident.  If any biopsies were taken you will be contacted by phone or by letter within the next 1-3 weeks.  Please call us at [phone] if you have not heard about the biopsies in 3 weeks.    SIGNATURES/CONFIDENTIALITY: You and/or your care partner have signed paperwork which will be entered into your electronic  medical record.  These signatures attest to the fact that that the information above on your After Visit Summary has been reviewed and is understood.  Full responsibility of the confidentiality of this discharge information lies with you and/or your care-partner.

## 2015-06-07 ENCOUNTER — Telehealth: Payer: Self-pay

## 2015-06-07 NOTE — Telephone Encounter (Signed)
No answer, left voicemail message.

## 2015-07-05 ENCOUNTER — Ambulatory Visit (INDEPENDENT_AMBULATORY_CARE_PROVIDER_SITE_OTHER): Payer: Managed Care, Other (non HMO) | Admitting: Internal Medicine

## 2015-07-05 ENCOUNTER — Encounter: Payer: Self-pay | Admitting: Internal Medicine

## 2015-07-05 VITALS — BP 122/74 | HR 68 | Ht 65.35 in | Wt 156.2 lb

## 2015-07-05 DIAGNOSIS — K648 Other hemorrhoids: Secondary | ICD-10-CM

## 2015-07-05 NOTE — Patient Instructions (Signed)
HEMORRHOID BANDING PROCEDURE    FOLLOW-UP CARE   1. The procedure you have had should have been relatively painless since the banding of the area involved does not have nerve endings and there is no pain sensation.  The rubber band cuts off the blood supply to the hemorrhoid and the band may fall off as soon as 48 hours after the banding (the band may occasionally be seen in the toilet bowl following a bowel movement). You may notice a temporary feeling of fullness in the rectum which should respond adequately to plain Tylenol or Motrin.  2. Following the banding, avoid strenuous exercise that evening and resume full activity the next day.  A sitz bath (soaking in a warm tub) or bidet is soothing, and can be useful for cleansing the area after bowel movements.     3. To avoid constipation, take two tablespoons of natural wheat bran, natural oat bran, flax, Benefiber or any over the counter fiber supplement and increase your water intake to 7-8 glasses daily.    4. Unless you have been prescribed anorectal medication, do not put anything inside your rectum for two weeks: No suppositories, enemas, fingers, etc.  5. Occasionally, you may have more bleeding than usual after the banding procedure.  This is often from the untreated hemorrhoids rather than the treated one.  Don't be concerned if there is a tablespoon or so of blood.  If there is more blood than this, lie flat with your bottom higher than your head and apply an ice pack to the area. If the bleeding does not stop within a half an hour or if you feel faint, call our office at [phone] or go to the emergency room.  6. Problems are not common; however, if there is a substantial amount of bleeding, severe pain, chills, fever or difficulty passing urine (very rare) or other problems, you should call us at [phone] or report to the nearest emergency room.  7. Do not stay seated continuously for more than 2-3 hours for a day or two  after the procedure.  Tighten your buttock muscles 10-15 times every two hours and take 10-15 deep breaths every 1-2 hours.  Do not spend more than a few minutes on the toilet if you cannot empty your bowel; instead re-visit the toilet at a later time.    We will see you at your next appointment for additional banding September 12th at 11:00AM    I appreciate the opportunity to care for you.

## 2015-07-05 NOTE — Assessment & Plan Note (Signed)
LL banded RTC 2-3 wks

## 2015-07-05 NOTE — Progress Notes (Signed)
   PROCEDURE NOTE: The patient presents with symptomatic grade 2  hemorrhoids, requesting rubber band ligation of his/her hemorrhoidal disease.  All risks, benefits and alternative forms of therapy were described and informed consent was obtained.   The anorectum was pre-medicated with 0.125% NTG and 5% lidocaine The decision was made to band the LL internal hemorrhoid, and the [facility] O'Regan System was used to perform band ligation without complication.  Digital anorectal examination was then performed to assure proper positioning of the band, and to adjust the banded tissue as required.  The patient was discharged home without pain or other issues.  Dietary and behavioral recommendations were given and along with follow-up instructions.       The patient will return in 2-3 wks for  follow-up and possible additional banding as required. No complications were encountered and the patient tolerated the procedure well.  I appreciate the opportunity to care for this patient.  ZO:XWRUE,AVWUJW, MD

## 2015-07-08 ENCOUNTER — Ambulatory Visit (INDEPENDENT_AMBULATORY_CARE_PROVIDER_SITE_OTHER): Payer: Managed Care, Other (non HMO) | Admitting: Family Medicine

## 2015-07-08 ENCOUNTER — Encounter: Payer: Self-pay | Admitting: Family Medicine

## 2015-07-08 ENCOUNTER — Encounter: Payer: Self-pay | Admitting: *Deleted

## 2015-07-08 VITALS — BP 138/84 | HR 70 | Ht 65.0 in | Wt 157.0 lb

## 2015-07-08 DIAGNOSIS — G8929 Other chronic pain: Secondary | ICD-10-CM | POA: Diagnosis not present

## 2015-07-08 DIAGNOSIS — M545 Low back pain, unspecified: Secondary | ICD-10-CM | POA: Insufficient documentation

## 2015-07-08 MED ORDER — GABAPENTIN 100 MG PO CAPS: 200.0000 mg | ORAL_CAPSULE | Freq: Every day | ORAL | Status: DC

## 2015-07-08 MED ORDER — TIZANIDINE HCL 4 MG PO TABS: 4.0000 mg | ORAL_TABLET | Freq: Three times a day (TID) | ORAL | Status: DC | PRN

## 2015-07-08 MED ORDER — IBUPROFEN-FAMOTIDINE 800-26.6 MG PO TABS: ORAL_TABLET | ORAL | Status: DC

## 2015-07-08 NOTE — Assessment & Plan Note (Signed)
We are attempting to get more information on this chronic injury. Patient did bring an MRI from 2009. This was reviewed by me and showed only some mild treating disc at L4-L5 and L5-S1. No nerve root impingement noted at that time. No bony abnormalities noted. Patient continues to have difficulty and does do a lot of manual labor at work. Repetitive motion seems to aggravate it more. We discussed with patient that hopefully most this is going to be musculoskeletal in nature. Patient put on light duty at work for the next 2 weeks. Patient given an anti-inflammatory, muscle relaxer, home exercises as well as given some gabapentin at night. 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 We discussed icing regimen. Patient will come back and see me again in 3 weeks for further evaluation. We may need to consider that patient avoids repetitive lifting multiple days in a row.

## 2015-07-08 NOTE — Progress Notes (Signed)
Pre visit review using our clinic review tool, if applicable. No additional management support is needed unless otherwise documented below in the visit note. 

## 2015-07-08 NOTE — Patient Instructions (Addendum)
Good to see you.  Ice 20 minutes 2 times daily. Usually after activity and before bed. Exercises 3 times a week.  Take Duexis, anti-inflammatory, 3x/day for 5days then as needed after-sent into [facility]  Take muscle relaxant, try at home first, to help with muscle tightness Take Gabapentin before bedtime to help with over active nerve  When lifting things, do with both hands, facing object, and with bent knees  Come back in 3 weeks to see how you are    Bueno nos vemos.  Hielo 20 minutos 2 veces al da.  Generalmente despus de la actividad y antes de [place].  Ejercicios 3 veces por semana.  Duexis, tomar antiinflamatorios, 3 x / da por 5 [employer], segn sea necesario despus de enviado en [place] Tomar relajante muscular, en primer lugar, intente en la casa para ayudar con la tensin muscular  Tomar gabapentina antes de acostarse para ayudar con ms nervio activo al [employer], hacer con ambas manos, hacia el [place] y con

## 2015-07-08 NOTE — Progress Notes (Signed)
[facility] [address] [place], [place] [address] Phone: [phone] Subjective:    I'm seeing this patient by the request  of:  Cobie Marcoux,KRISTI, MD Darrick Huntsman Md.   CC: Back pain  [id number] [facility] is a 49 y.o. male coming in with complaint of back pain. Patient has had back pain for quite some time. Patient has even had serologic workup which was unremarkable for inflammatory arthritis. Patient states he did have an injury in 2008. Patient states that this was a work-related injury. Patient did have an MRI in 2009 which was reviewed by me. Patient does have some mild changes but no significant bony abnormality noted at that time. Patient states that he is in a new job and unfortunately when he does repetitive lifting over and over again this can be very taxing. Patient states that he gets significant tightness especially when he does a 2 or 3 days in a row. Patient states is more of a tightness on the left side and can have radicular symptoms going down the left posterior aspect of the leg just past the knee states. Never to his foot. Denies any weakness but states that at the end of a long day sometimes it feels hard to lift his leg. Denies any bowel or bladder incontinence and denies any fevers chills or any abnormal weight loss. Patient has not taken any medication at this time but was on pain management back in 2009. She rates the severity of pain a 7 out of 10. States that it is starting to affect his daily activities as well. Cannot stay as active as he would like. Patient is to do some working out and is unable to do so due to the discomfort.     Past Medical History  Diagnosis Date  . Hypertension   . Seborrhea   . Allergic conjunctivitis   . Arthritis   . Thrombocytopenia   . Degenerative arthritis 04/20/2013  . Cataract   . NAFLD (nonalcoholic fatty liver disease) 8/65/7846   Past Surgical History  Procedure Laterality Date  . Hernia  repair      x 3  . Colonoscopy     Social History  Substance Use Topics  . Smoking status: Never Smoker   . Smokeless tobacco: Never Used  . Alcohol Use: 0.0 oz/week    0 Standard drinks or equivalent per week     Comment: rarely drinks due to Red blood cell being low   Allergies  Allergen Reactions  . Tramadol Nausea Only   Family History  Problem Relation Age of Onset  . Arthritis Mother   . Diabetes Mother   . Hypertension Mother   . Arthritis Father   . Prostate cancer Father   . Stroke Father     CVA x 5  . Hypertension Father   . Diabetes Father     with leg ampuation  . Diabetes Brother   . HIV Brother   . Colon cancer Maternal Grandfather      Past medical history, social, surgical and family history all reviewed in electronic medical record.   Review of Systems: No headache, visual changes, nausea, vomiting, diarrhea, constipation, dizziness, abdominal pain, skin rash, fevers, chills, night sweats, weight loss, swollen lymph nodes, body aches, joint swelling, muscle aches, chest pain, shortness of breath, mood changes.   Objective Blood pressure 138/84, pulse 70, height  (1.651 m), weight 157 lb (71.215 kg), SpO2 96 %.  General: No apparent distress  alert and oriented x3 mood and affect normal, dressed appropriately.  HEENT: Pupils equal, extraocular movements intact  Respiratory: Patient's speak in full sentences and does not appear short of breath  Cardiovascular: No lower extremity edema, non tender, no erythema  Skin: Warm dry intact with no signs of infection or rash on extremities or on axial skeleton.  Abdomen: Soft nontender  Neuro: Cranial nerves II through XII are intact, neurovascularly intact in all extremities with 2+ DTRs and 2+ pulses.  Lymph: No lymphadenopathy of posterior or anterior cervical chain or axillae bilaterally.  Gait normal with good balance and coordination.  MSK:  Non tender with full range of motion and good stability and  symmetric strength and tone of shoulders, elbows, wrist, hip, knee and ankles bilaterally.  Back Exam:  Inspection: Unremarkable  Motion: Flexion 35 deg, Extension 35 deg, Side Bending to 35 deg bilaterally,  Rotation to 35 deg bilaterally  SLR laying: Negative  XSLR laying: Negative  Palpable tenderness: Tender to palpation in the per her spinal musculature of the left side of the lumbar spine as well as the left sacroiliac joint.Marland Kitchen FABER: negative. Sensory change: Gross sensation intact to all lumbar and sacral dermatomes.  Reflexes: 2+ at both patellar tendons, 2+ at achilles tendons, Babinski's downgoing.  Strength at foot  Plantar-flexion: 5/5 Dorsi-flexion: 5/5 Eversion: 5/5 Inversion: 5/5  Leg strength  Quad: 5/5 Hamstring: 5/5 Hip flexor: 5/5 Hip abductors: 5/5  Gait unremarkable. Patient does get up from the chair very carefully.  Procedure note 97110; 15 minutes spent for Therapeutic exercises as stated in above notes.  This included exercises focusing on stretching, strengthening, with significant focus on eccentric aspects.  - Low back exercises that included:  Pelvic tilt/bracing instruction to focus on control of the pelvic girdle and lower abdominal muscles  Glute strengthening exercises, focusing on proper firing of the glutes without engaging the low back muscles Proper stretching techniques for maximum relief for the hamstrings, hip flexors, low back and some rotation where tolerated Proper technique shown and discussed handout in great detail with ATC.  All questions were discussed and answered.     Impression and Recommendations:     This case required medical decision making of moderate complexity.

## 2015-07-22 ENCOUNTER — Encounter: Payer: Self-pay | Admitting: *Deleted

## 2015-07-22 ENCOUNTER — Telehealth: Payer: Self-pay | Admitting: Family Medicine

## 2015-07-22 ENCOUNTER — Ambulatory Visit (INDEPENDENT_AMBULATORY_CARE_PROVIDER_SITE_OTHER): Payer: Managed Care, Other (non HMO) | Admitting: Internal Medicine

## 2015-07-22 ENCOUNTER — Encounter: Payer: Self-pay | Admitting: Internal Medicine

## 2015-07-22 VITALS — BP 124/86 | HR 76 | Ht 65.35 in | Wt 158.4 lb

## 2015-07-22 DIAGNOSIS — K648 Other hemorrhoids: Secondary | ICD-10-CM

## 2015-07-22 NOTE — Progress Notes (Signed)
Patient ID: [facility], male   DOB: 02/09/1966, 49 y.o.   MRN: [id number]        PROCEDURE NOTE: The patient presents with symptomatic grade 2 hemorrhoids, requesting rubber band ligation of his/her hemorrhoidal disease.  All risks, benefits and alternative forms of therapy were described and informed consent was obtained.   The anorectum was pre-medicated with 0.125% NTG and 5% lidocaine The decision was made to band the RP and RA internal hemorrhoid, and the [facility] O'Regan System was used to perform band ligation without complication.  Digital anorectal examination was then performed to assure proper positioning of the band, and to adjust the banded tissue as required.  The patient was discharged home without pain or other issues.  Dietary and behavioral recommendations were given and along with follow-up instructions.     The following adjunctive treatments were recommended:  Benefiber  The patient will return in 2 monthsfor  follow-up and possible additional banding as required. No complications were encountered and the patient tolerated the procedure well.

## 2015-07-22 NOTE — Assessment & Plan Note (Signed)
Banding

## 2015-07-22 NOTE — Telephone Encounter (Signed)
Pt & Pt's spouse came by needing a work note for this week.  Pt sees Dr. Katrinka Blazing next week for a follow up.

## 2015-07-22 NOTE — Telephone Encounter (Signed)
Spoke to pt's wife, letter for work will be left at the front desk for them to come by & pick up.

## 2015-07-22 NOTE — Patient Instructions (Addendum)
  HEMORRHOID BANDING PROCEDURE    FOLLOW-UP CARE   1. The procedure you have had should have been relatively painless since the banding of the area involved does not have nerve endings and there is no pain sensation.  The rubber band cuts off the blood supply to the hemorrhoid and the band may fall off as soon as 48 hours after the banding (the band may occasionally be seen in the toilet bowl following a bowel movement). You may notice a temporary feeling of fullness in the rectum which should respond adequately to plain Tylenol or Motrin.  2. Following the banding, avoid strenuous exercise that evening and resume full activity the next day.  A sitz bath (soaking in a warm tub) or bidet is soothing, and can be useful for cleansing the area after bowel movements.     3. To avoid constipation, take two tablespoons of natural wheat bran, natural oat bran, flax, Benefiber or any over the counter fiber supplement and increase your water intake to 7-8 glasses daily.    4. Unless you have been prescribed anorectal medication, do not put anything inside your rectum for two weeks: No suppositories, enemas, fingers, etc.  5. Occasionally, you may have more bleeding than usual after the banding procedure.  This is often from the untreated hemorrhoids rather than the treated one.  Don't be concerned if there is a tablespoon or so of blood.  If there is more blood than this, lie flat with your bottom higher than your head and apply an ice pack to the area. If the bleeding does not stop within a half an hour or if you feel faint, call our office at [phone] or go to the emergency room.  6. Problems are not common; however, if there is a substantial amount of bleeding, severe pain, chills, fever or difficulty passing urine (very rare) or other problems, you should call us at [phone] or report to the nearest emergency room.  7. Do not stay seated continuously for more than 2-3 hours for a day or  two after the procedure.  Tighten your buttock muscles 10-15 times every two hours and take 10-15 deep breaths every 1-2 hours.  Do not spend more than a few minutes on the toilet if you cannot empty your bowel; instead re-visit the toilet at a later time.     You have a banding follow-up appointment with Dr. Leone Payor on 09/27/15 at 11:30 am.  Please stay on your Benifiber.  I appreciate the opportunity to care for you. Iva Boop, M.D., [facility]

## 2015-07-29 ENCOUNTER — Ambulatory Visit (INDEPENDENT_AMBULATORY_CARE_PROVIDER_SITE_OTHER): Payer: Managed Care, Other (non HMO) | Admitting: Family Medicine

## 2015-07-29 ENCOUNTER — Encounter: Payer: Self-pay | Admitting: *Deleted

## 2015-07-29 ENCOUNTER — Encounter: Payer: Self-pay | Admitting: Family Medicine

## 2015-07-29 VITALS — BP 130/86 | HR 73 | Ht 65.0 in | Wt 159.0 lb

## 2015-07-29 DIAGNOSIS — M545 Low back pain, unspecified: Secondary | ICD-10-CM

## 2015-07-29 DIAGNOSIS — G8929 Other chronic pain: Secondary | ICD-10-CM

## 2015-07-29 NOTE — Patient Instructions (Signed)
Good to see you Continue the gabapentin  at night Ibuprofen when needed Ice still after activity Continue the exercises and the stretches See me again in 4 weeks to make sure your work is following direction.

## 2015-07-29 NOTE — Progress Notes (Signed)
[name] [address] [place], [place] [address] Phone: [phone] Subjective:    CC: Back pain follow-up  BJY:[id number] Julian Bates is a 49 y.o. male coming in with complaint of back pain. Patient has had back pain for quite some time. Patient has even had serologic workup which was unremarkable for inflammatory arthritis. Patient states he did have an injury in 2008. Patient states that this was a work-related injury. Patient did have an MRI in 2009 which was reviewed by me. Patient does have some mild changes but no significant bony abnormality noted at that time. Patient starting a job and was having worsening back pain. This seemed to have significant stiffness from repetitive activity. Patient was put on light duty for 2 weeks as well as started on gabapentin at night and had anti-inflammatory's. Patient given home exercises. Patient states he had been doing very well with decreasing his activity. Still though when he went back to some of the repetitive movements he had worsening symptoms again. Patient had to take the muscle relaxer when needed. Patient states that the medicine at night has helped but does make him sleepy in the morning. Denies any new symptoms such as radiation down the leg at this moment or any weakness.     Past Medical History  Diagnosis Date  . Hypertension   . Seborrhea   . Allergic conjunctivitis   . Arthritis   . Thrombocytopenia   . Degenerative arthritis 04/20/2013  . Cataract   . NAFLD (nonalcoholic fatty liver disease) 8/65/7846   Past Surgical History  Procedure Laterality Date  . Hernia repair      x 3  . Colonoscopy    . Hemorrhoid banding     Social History  Substance Use Topics  . Smoking status: Never Smoker   . Smokeless tobacco: Never Used  . Alcohol Use: 0.0 oz/week    0 Standard drinks or equivalent per week     Comment: rarely drinks due to Red blood cell being low   Allergies  Allergen  Reactions  . Tramadol Nausea Only   Family History  Problem Relation Age of Onset  . Arthritis Mother   . Diabetes Mother   . Hypertension Mother   . Arthritis Father   . Prostate cancer Father   . Stroke Father     CVA x 5  . Hypertension Father   . Diabetes Father     with leg ampuation  . Diabetes Brother   . HIV Brother   . Colon cancer Maternal Grandfather      Past medical history, social, surgical and family history all reviewed in electronic medical record.   Review of Systems: No headache, visual changes, nausea, vomiting, diarrhea, constipation, dizziness, abdominal pain, skin rash, fevers, chills, night sweats, weight loss, swollen lymph nodes, body aches, joint swelling, muscle aches, chest pain, shortness of breath, mood changes.   Objective Blood pressure 130/86, pulse 73, height  (1.651 m), weight 159 lb (72.122 kg), SpO2 98 %.  General: No apparent distress alert and oriented x3 mood and affect normal, dressed appropriately.  HEENT: Pupils equal, extraocular movements intact  Respiratory: Patient's speak in full sentences and does not appear short of breath  Cardiovascular: No lower extremity edema, non tender, no erythema  Skin: Warm dry intact with no signs of infection or rash on extremities or on axial skeleton.  Abdomen: Soft nontender  Neuro: Cranial nerves II through XII are intact, neurovascularly intact in  all extremities with 2+ DTRs and 2+ pulses.  Lymph: No lymphadenopathy of posterior or anterior cervical chain or axillae bilaterally.  Gait normal with good balance and coordination.  MSK:  Non tender with full range of motion and good stability and symmetric strength and tone of shoulders, elbows, wrist, hip, knee and ankles bilaterally.  Back Exam:  Inspection: Unremarkable  Motion: Flexion 35 deg, Extension 35 deg, Side Bending to 35 deg bilaterally,  Rotation to 35 deg bilaterally  SLR laying: Negative  XSLR laying: Negative  Palpable  tenderness:  continued stiffness of the P spinal musculature and some mild tenderness of the lumbar spine FABER: negative. Sensory change: Gross sensation intact to all lumbar and sacral dermatomes.  Reflexes: 2+ at both patellar tendons, 2+ at achilles tendons, Babinski's downgoing.  Strength at foot  Plantar-flexion: 5/5 Dorsi-flexion: 5/5 Eversion: 5/5 Inversion: 5/5  Leg strength  Quad: 5/5 Hamstring: 5/5 Hip flexor: 5/5 Hip abductors: 5/5  Gait unremarkable. Improve movement in and out of chair from previous exam      Impression and Recommendations:     This case required medical decision making of moderate complexity.

## 2015-07-29 NOTE — Progress Notes (Signed)
Pre visit review using our clinic review tool, if applicable. No additional management support is needed unless otherwise documented below in the visit note. 

## 2015-07-29 NOTE — Assessment & Plan Note (Signed)
Discussed with patient as well as his wife and translator today. 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 Discuss that I would like to have him avoid doing this manually. I have been told by them that there is a crane but they do not allow him to use it. Patient was given a letter today that the only way that he can do this activity secondary to his chronic condition is to do it with the mechanical assist device. I believe that he would be able to do this if he avoided to significant lifting. Patient can do all other facets including walking stooping and bending but no significant bending with weight. Patient will continue all other conservative therapy including the gabapentin at night. Patient and will come back and see me again in 4 weeks and we'll make sure that he is doing relatively well.  Spent  25 minutes with patient face-to-face and had greater than 50% of counseling including as described above in assessment and plan.

## 2015-08-06 ENCOUNTER — Other Ambulatory Visit: Payer: Self-pay | Admitting: Family Medicine

## 2015-08-27 ENCOUNTER — Ambulatory Visit: Payer: Managed Care, Other (non HMO) | Admitting: Family Medicine

## 2015-08-27 DIAGNOSIS — Z0289 Encounter for other administrative examinations: Secondary | ICD-10-CM

## 2015-09-08 ENCOUNTER — Other Ambulatory Visit: Payer: Self-pay | Admitting: Physician Assistant

## 2015-09-27 ENCOUNTER — Ambulatory Visit: Payer: Managed Care, Other (non HMO) | Admitting: Internal Medicine

## 2015-10-02 ENCOUNTER — Other Ambulatory Visit: Payer: Self-pay | Admitting: Physician Assistant

## 2015-10-04 ENCOUNTER — Ambulatory Visit (INDEPENDENT_AMBULATORY_CARE_PROVIDER_SITE_OTHER): Payer: Managed Care, Other (non HMO) | Admitting: Family Medicine

## 2015-10-04 ENCOUNTER — Encounter: Payer: Self-pay | Admitting: Family Medicine

## 2015-10-04 VITALS — BP 119/75 | HR 75 | Temp 98.4°F | Resp 16 | Wt 158.4 lb

## 2015-10-04 DIAGNOSIS — Z23 Encounter for immunization: Secondary | ICD-10-CM | POA: Diagnosis not present

## 2015-10-04 DIAGNOSIS — I10 Essential (primary) hypertension: Secondary | ICD-10-CM

## 2015-10-04 DIAGNOSIS — K760 Fatty (change of) liver, not elsewhere classified: Secondary | ICD-10-CM

## 2015-10-04 DIAGNOSIS — H9202 Otalgia, left ear: Secondary | ICD-10-CM

## 2015-10-04 DIAGNOSIS — L210 Seborrhea capitis: Secondary | ICD-10-CM

## 2015-10-04 DIAGNOSIS — Z630 Problems in relationship with spouse or partner: Secondary | ICD-10-CM | POA: Diagnosis not present

## 2015-10-04 DIAGNOSIS — H6982 Other specified disorders of Eustachian tube, left ear: Secondary | ICD-10-CM | POA: Diagnosis not present

## 2015-10-04 DIAGNOSIS — L219 Seborrheic dermatitis, unspecified: Secondary | ICD-10-CM

## 2015-10-04 LAB — CBC WITH DIFFERENTIAL/PLATELET
BASOS PCT: 1 % (ref 0–1)
Basophils Absolute: 0.1 10*3/uL (ref 0.0–0.1)
EOS ABS: 0.1 10*3/uL (ref 0.0–0.7)
Eosinophils Relative: 2 % (ref 0–5)
HCT: 44.2 % (ref 39.0–52.0)
Hemoglobin: 16.1 g/dL (ref 13.0–17.0)
Lymphocytes Relative: 17 % (ref 12–46)
Lymphs Abs: 1.2 10*3/uL (ref 0.7–4.0)
MCH: 32.8 pg (ref 26.0–34.0)
MCHC: 36.4 g/dL — ABNORMAL HIGH (ref 30.0–36.0)
MCV: 90 fL (ref 78.0–100.0)
MPV: 10.1 fL (ref 8.6–12.4)
Monocytes Absolute: 0.5 10*3/uL (ref 0.1–1.0)
Monocytes Relative: 8 % (ref 3–12)
NEUTROS ABS: 4.9 10*3/uL (ref 1.7–7.7)
NEUTROS PCT: 72 % (ref 43–77)
PLATELETS: 130 10*3/uL — AB (ref 150–400)
RBC: 4.91 MIL/uL (ref 4.22–5.81)
RDW: 13.6 % (ref 11.5–15.5)
WBC: 6.8 10*3/uL (ref 4.0–10.5)

## 2015-10-04 LAB — COMPREHENSIVE METABOLIC PANEL
ALBUMIN: 4.3 g/dL (ref 3.6–5.1)
ALT: 38 U/L (ref 9–46)
AST: 20 U/L (ref 10–35)
Alkaline Phosphatase: 70 U/L (ref 40–115)
BILIRUBIN TOTAL: 0.7 mg/dL (ref 0.2–1.2)
BUN: 15 mg/dL (ref 7–25)
CHLORIDE: 104 mmol/L (ref 98–110)
CO2: 28 mmol/L (ref 20–31)
CREATININE: 0.97 mg/dL (ref 0.70–1.33)
Calcium: 9.3 mg/dL (ref 8.6–10.3)
Glucose, Bld: 101 mg/dL — ABNORMAL HIGH (ref 65–99)
Potassium: 4 mmol/L (ref 3.5–5.3)
SODIUM: 139 mmol/L (ref 135–146)
Total Protein: 6.8 g/dL (ref 6.1–8.1)

## 2015-10-04 MED ORDER — ENALAPRIL MALEATE 20 MG PO TABS: 20.0000 mg | ORAL_TABLET | Freq: Every day | ORAL | Status: DC

## 2015-10-04 MED ORDER — LORATADINE-PSEUDOEPHEDRINE ER 5-120 MG PO TB12: 1.0000 | ORAL_TABLET | Freq: Two times a day (BID) | ORAL | Status: DC

## 2015-10-04 MED ORDER — FLUTICASONE PROPIONATE 50 MCG/ACT NA SUSP: 2.0000 | Freq: Every day | NASAL | Status: DC

## 2015-10-04 MED ORDER — KETOCONAZOLE 2 % EX SHAM: 1.0000 "application " | MEDICATED_SHAMPOO | CUTANEOUS | Status: DC

## 2015-10-04 MED ORDER — DESONIDE 0.05 % EX CREA: TOPICAL_CREAM | Freq: Two times a day (BID) | CUTANEOUS | Status: DC

## 2015-10-04 MED ORDER — ATENOLOL 50 MG PO TABS: 50.0000 mg | ORAL_TABLET | Freq: Every day | ORAL | Status: DC

## 2015-10-04 MED ORDER — VALACYCLOVIR HCL 500 MG PO TABS: ORAL_TABLET | ORAL | Status: DC

## 2015-10-04 NOTE — Progress Notes (Signed)
Subjective:    Patient ID: Julian Bates, male    DOB: 1966/04/24, 49 y.o.   MRN: [id number]  10/04/2015  Medication Refill; Ear Pain; and Hypertension   HPI This 49 y.o. male presents for six month follow-up:  Ear pain L: onset past 2-4 weeks;  S/p ear irrigation at last visit; has pain in B ears since ear irrigation.  LEFT ear only. No fever/chills/sweats.  Mild nasal congestion for two weeks.  No sore throat.  Warm in bedroom. Sleeps with two fans.  +nasal pain; +sinus pressure this week.  Colon cancer screening: s/p colonoscopy WNL.  Hemorrhoids: s/p banding by Carlean Purl; appointment in January to see Carlean Purl again; having some mild bleeding.  Fatty Liver non-alcoholic: fatty liver.  S/p abdominal US by Carlean Purl; no previous Hepatitis A and B vaccines.   S/p Hepatitis C screening in past by [place].    No flu vaccine this season; agreeable today.  DDD lumbar:  S/p two visits by Dr. Hulan Saas.  Demoted patient at work due to chronic back pain; happy with work.  Now doing much better with change in work.   Seborrhea capitis: Patient reports good compliance with medication, good tolerance to medication, and good symptom control.    Marital strain: wife has been sleeping in different room since 12/2014. Pt was talking to several different women via Facebook.  After wife discovered that patient was talking to various women on Facebook, pt continued to communicate with women. Wife has no trust for patient and cannot return relationship and intimacy back to normal.  Pt has not been willing to go to therapy; wife interested in counseling.     Review of Systems  Constitutional: Negative for fever, chills, diaphoresis, activity change, appetite change and fatigue.  HENT: Positive for congestion, ear pain and sinus pressure. Negative for ear discharge, facial swelling, hearing loss, mouth sores, postnasal drip, rhinorrhea, sneezing, sore throat, tinnitus, trouble swallowing and voice  change.   Respiratory: Negative for cough and shortness of breath.   Cardiovascular: Negative for chest pain, palpitations and leg swelling.  Gastrointestinal: Negative for nausea, vomiting, abdominal pain and diarrhea.  Endocrine: Negative for cold intolerance, heat intolerance, polydipsia, polyphagia and polyuria.  Musculoskeletal: Positive for back pain.  Skin: Negative for color change, rash and wound.  Neurological: Negative for dizziness, tremors, seizures, syncope, facial asymmetry, speech difficulty, weakness, light-headedness, numbness and headaches.  Psychiatric/Behavioral: Negative for sleep disturbance and dysphoric mood. The patient is not nervous/anxious.     Past Medical History  Diagnosis Date  . Hypertension   . Seborrhea   . Allergic conjunctivitis   . Arthritis   . Thrombocytopenia ([place])   . Degenerative arthritis 04/20/2013  . Cataract   . NAFLD (nonalcoholic fatty liver disease) 06/06/2015   Past Surgical History  Procedure Laterality Date  . Hernia repair      x 3  . Colonoscopy    . Hemorrhoid banding     Allergies  Allergen Reactions  . Tramadol Nausea Only    Social History   Social History  . Marital Status: Married    Spouse Name: N/A  . Number of Children: N/A  . Years of Education: N/A   Occupational History  . machine operator    Social History Main Topics  . Smoking status: Never Smoker   . Smokeless tobacco: Never Used  . Alcohol Use: 0.0 oz/week    0 Standard drinks or equivalent per week     Comment: rarely drinks due to  Red blood cell being low  . Drug Use: No  . Sexual Activity: Yes   Other Topics Concern  . Not on file   Social History Narrative   Marital status: married x 15 years.  From [place]; moved to [place] in 2008.      Children:  4 children; 1 stepchild.  Six grandchildren.      Lives:  With wife #2, dog.      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  Moderately happy.      Tobacco: none       Alcohol: drinks 2 beers on weekends; previous heavier use daily.        Drugs: none       Exercise:  None       Seatbelt:  100% of time.      Guns:     Family History  Problem Relation Age of Onset  . Arthritis Mother   . Diabetes Mother   . Hypertension Mother   . Arthritis Father   . Prostate cancer Father   . Stroke Father     CVA x 5  . Hypertension Father   . Diabetes Father     with leg ampuation  . Diabetes Brother   . HIV Brother   . Colon cancer Maternal Grandfather        Objective:    BP 119/75 mmHg  Pulse 75  Temp(Src) 98.4 F (36.9 C) (Oral)  Resp 16  Wt 158 lb 6.4 oz (71.85 kg) Physical Exam  Constitutional: He is oriented to person, place, and time. He appears well-developed and well-nourished. No distress.  HENT:  Head: Normocephalic and atraumatic.  Right Ear: Tympanic membrane, external ear and ear canal normal.  Left Ear: Tympanic membrane, external ear and ear canal normal.  Nose: Nose normal.  Mouth/Throat: Oropharynx is clear and moist. No oropharyngeal exudate.  Eyes: Conjunctivae and EOM are normal. Pupils are equal, round, and reactive to light.  Neck: Normal range of motion. Neck supple. Carotid bruit is not present. No thyromegaly present.  Cardiovascular: Normal rate, regular rhythm, normal heart sounds and intact distal pulses.  Exam reveals no gallop and no friction rub.   No murmur heard. Pulmonary/Chest: Effort normal and breath sounds normal. He has no wheezes. He has no rales.  Abdominal: Soft. Bowel sounds are normal. He exhibits no distension and no mass. There is no tenderness. There is no rebound and no guarding.  Lymphadenopathy:    He has no cervical adenopathy.  Neurological: He is alert and oriented to person, place, and time. No cranial nerve deficit.  Skin: Skin is warm and dry. No rash noted. He is not diaphoretic.  Psychiatric: He has a normal mood and affect. His behavior is normal.  Nursing note and vitals  reviewed.  Results for orders placed or performed in visit on 02/27/15  CBC with Differential/Platelet  Result Value Ref Range   WBC 6.4 4.0 - 10.5 K/uL   RBC 5.11 4.22 - 5.81 MIL/uL   Hemoglobin 16.3 13.0 - 17.0 g/dL   HCT 46.1 39.0 - 52.0 %   MCV 90.2 78.0 - 100.0 fL   MCH 31.9 26.0 - 34.0 pg   MCHC 35.4 30.0 - 36.0 g/dL   RDW 13.6 11.5 - 15.5 %   Platelets 140 (L) 150 - 400 K/uL   MPV 9.8 8.6 - 12.4 fL   Neutrophils Relative % 68 43 - 77 %   Neutro Abs 4.4 1.7 -  7.7 K/uL   Lymphocytes Relative 22 12 - 46 %   Lymphs Abs 1.4 0.7 - 4.0 K/uL   Monocytes Relative 7 3 - 12 %   Monocytes Absolute 0.4 0.1 - 1.0 K/uL   Eosinophils Relative 2 0 - 5 %   Eosinophils Absolute 0.1 0.0 - 0.7 K/uL   Basophils Relative 1 0 - 1 %   Basophils Absolute 0.1 0.0 - 0.1 K/uL   Smear Review Criteria for review not met   Comprehensive metabolic panel  Result Value Ref Range   Sodium 140 135 - 145 mEq/L   Potassium 4.1 3.5 - 5.3 mEq/L   Chloride 103 96 - 112 mEq/L   CO2 28 19 - 32 mEq/L   Glucose, Bld 98 70 - 99 mg/dL   BUN 14 6 - 23 mg/dL   Creat 0.86 0.50 - 1.35 mg/dL   Total Bilirubin 0.9 0.2 - 1.2 mg/dL   Alkaline Phosphatase 57 39 - 117 U/L   AST 23 0 - 37 U/L   ALT 48 0 - 53 U/L   Total Protein 7.0 6.0 - 8.3 g/dL   Albumin 4.5 3.5 - 5.2 g/dL   Calcium 9.0 8.4 - 10.5 mg/dL  Lipid panel  Result Value Ref Range   Cholesterol 139 0 - 200 mg/dL   Triglycerides 236 (H) <150 mg/dL   HDL 27 (L) >=40 mg/dL   Total CHOL/HDL Ratio 5.1 Ratio   VLDL 47 (H) 0 - 40 mg/dL   LDL Cholesterol 65 0 - 99 mg/dL       Assessment & Plan:   1. Essential hypertension, benign   2. NAFLD (nonalcoholic fatty liver disease)   3. Seborrhea capitis   4. Left ear pain   5. Need for prophylactic vaccination and inoculation against influenza   6. Need for prophylactic vaccination and inoculation against viral hepatitis   7. Seborrhea    1. HTN: controlled; obtain labs; refills provided. 2.  NAFLD: Newly  diagnosed; s/p abdominal US that revealed findings; normal LFTs; obtain labs; s/p Hepatitis A and B#1 in office. 3.  Seborrhea capitis: stable; refills provided. 4.  L ear pain/eustachian tube dysfunction L; New. Rx for Flonase and Claritin D provided. 5.  S/p influenza vaccine. 6. S/p Hepatitis A and B vaccines; RTC six months for Hepatitis A#1 and Hepatitis B#2. 7. Marital strain: New.  Provided list of counselors for marital counseling.   Orders Placed This Encounter  Procedures  . Flu Vaccine QUAD 36+ mos IM  . Hepatitis A vaccine adult IM  . Hepatitis B vaccine adult IM  . CBC with Differential/Platelet  . Comprehensive metabolic panel   Meds ordered this encounter  Medications  . fluticasone (FLONASE) 50 MCG/ACT nasal spray    Sig: Place 2 sprays into both nostrils daily.    Dispense:  16 g    Refill:  6  . loratadine-pseudoephedrine (CLARITIN-D 12-HOUR) 5-120 MG tablet    Sig: Take 1 tablet by mouth 2 (two) times daily.    Dispense:  30 tablet    Refill:  0  . valACYclovir (VALTREX) 500 MG tablet    Sig: TAKE ONE CAPLET BY MOUTH ONCE DAILY    Dispense:  90 tablet    Refill:  3  . ketoconazole (NIZORAL) 2 % shampoo    Sig: Apply 1 application topically 2 (two) times a week.    Dispense:  120 mL    Refill:  11  . enalapril (VASOTEC) 20 MG tablet  Sig: Take 1 tablet (20 mg total) by mouth daily.    Dispense:  90 tablet    Refill:  3  . atenolol (TENORMIN) 50 MG tablet    Sig: Take 1 tablet (50 mg total) by mouth daily.    Dispense:  90 tablet    Refill:  3  . desonide (DESOWEN) 0.05 % cream    Sig: Apply topically 2 (two) times daily.    Dispense:  30 g    Refill:  3    Return in about 6 months (around 04/02/2016) for complete physical examiniation.    Deslyn Cavenaugh Elayne Guerin, M.D. [facility] [address] [place], [place]  [address] [phone] phone [phone] fax

## 2015-10-04 NOTE — Patient Instructions (Signed)
Recommend the following therapist/counselor:   Charlie PitterWilliam Rodriguez [phone]  Harle BattiestJulia Tabor [phone]  Oscar LaJoanna Warren [phone]  Coral SpikesJuan Santos [phone]

## 2015-10-16 ENCOUNTER — Telehealth: Payer: Self-pay

## 2015-10-16 MED ORDER — PREDNISONE 20 MG PO TABS: ORAL_TABLET | ORAL | Status: DC

## 2015-10-16 MED ORDER — AMOXICILLIN-POT CLAVULANATE 875-125 MG PO TABS: 1.0000 | ORAL_TABLET | Freq: Two times a day (BID) | ORAL | Status: DC

## 2015-10-16 NOTE — Telephone Encounter (Signed)
Any suggestions Dr. Katrinka BlazingSmith?  Ear pain L: onset past 2-4 weeks; S/p ear irrigation at last visit; has pain in B ears since ear irrigation. LEFT ear only. No fever/chills/sweats. Mild nasal congestion for two weeks. No sore throat. Warm in bedroom. Sleeps with two fans. +nasal pain; +sinus pressure this week.

## 2015-10-16 NOTE — Telephone Encounter (Signed)
Call --- I sent in Augmentin and Prednisone to pharmacy for patient to take.

## 2015-10-16 NOTE — Telephone Encounter (Signed)
Advised wife Rx's were sent in.

## 2015-10-16 NOTE — Telephone Encounter (Signed)
Pt is still having ear pain and would like to know what is next to do

## 2015-10-29 ENCOUNTER — Encounter: Payer: Self-pay | Admitting: Family Medicine

## 2015-11-05 ENCOUNTER — Ambulatory Visit: Payer: Managed Care, Other (non HMO)

## 2015-11-05 DIAGNOSIS — K760 Fatty (change of) liver, not elsewhere classified: Secondary | ICD-10-CM | POA: Diagnosis not present

## 2015-12-04 ENCOUNTER — Ambulatory Visit: Payer: Managed Care, Other (non HMO) | Admitting: Internal Medicine

## 2015-12-25 ENCOUNTER — Ambulatory Visit (INDEPENDENT_AMBULATORY_CARE_PROVIDER_SITE_OTHER): Payer: Managed Care, Other (non HMO) | Admitting: Family Medicine

## 2015-12-25 ENCOUNTER — Encounter: Payer: Self-pay | Admitting: Family Medicine

## 2015-12-25 ENCOUNTER — Ambulatory Visit (INDEPENDENT_AMBULATORY_CARE_PROVIDER_SITE_OTHER): Payer: Managed Care, Other (non HMO)

## 2015-12-25 VITALS — BP 128/82 | HR 74 | Temp 98.0°F | Resp 16 | Ht 65.0 in | Wt 153.0 lb

## 2015-12-25 DIAGNOSIS — L219 Seborrheic dermatitis, unspecified: Secondary | ICD-10-CM | POA: Diagnosis not present

## 2015-12-25 DIAGNOSIS — Z0000 Encounter for general adult medical examination without abnormal findings: Secondary | ICD-10-CM | POA: Diagnosis not present

## 2015-12-25 DIAGNOSIS — R059 Cough, unspecified: Secondary | ICD-10-CM

## 2015-12-25 DIAGNOSIS — E7800 Pure hypercholesterolemia, unspecified: Secondary | ICD-10-CM | POA: Diagnosis not present

## 2015-12-25 DIAGNOSIS — Z131 Encounter for screening for diabetes mellitus: Secondary | ICD-10-CM | POA: Diagnosis not present

## 2015-12-25 DIAGNOSIS — L210 Seborrhea capitis: Secondary | ICD-10-CM

## 2015-12-25 DIAGNOSIS — Z114 Encounter for screening for human immunodeficiency virus [HIV]: Secondary | ICD-10-CM | POA: Diagnosis not present

## 2015-12-25 DIAGNOSIS — I10 Essential (primary) hypertension: Secondary | ICD-10-CM | POA: Diagnosis not present

## 2015-12-25 DIAGNOSIS — R05 Cough: Secondary | ICD-10-CM

## 2015-12-25 DIAGNOSIS — Z125 Encounter for screening for malignant neoplasm of prostate: Secondary | ICD-10-CM | POA: Diagnosis not present

## 2015-12-25 DIAGNOSIS — M25561 Pain in right knee: Secondary | ICD-10-CM | POA: Diagnosis not present

## 2015-12-25 LAB — CBC WITH DIFFERENTIAL/PLATELET
BASOS PCT: 0 % (ref 0–1)
Basophils Absolute: 0 10*3/uL (ref 0.0–0.1)
EOS ABS: 0.2 10*3/uL (ref 0.0–0.7)
Eosinophils Relative: 3 % (ref 0–5)
HCT: 44.8 % (ref 39.0–52.0)
Hemoglobin: 16 g/dL (ref 13.0–17.0)
LYMPHS ABS: 1.3 10*3/uL (ref 0.7–4.0)
Lymphocytes Relative: 22 % (ref 12–46)
MCH: 32.5 pg (ref 26.0–34.0)
MCHC: 35.7 g/dL (ref 30.0–36.0)
MCV: 90.9 fL (ref 78.0–100.0)
MONO ABS: 0.7 10*3/uL (ref 0.1–1.0)
MONOS PCT: 12 % (ref 3–12)
MPV: 9.4 fL (ref 8.6–12.4)
Neutro Abs: 3.8 10*3/uL (ref 1.7–7.7)
Neutrophils Relative %: 63 % (ref 43–77)
PLATELETS: 122 10*3/uL — AB (ref 150–400)
RBC: 4.93 MIL/uL (ref 4.22–5.81)
RDW: 13.6 % (ref 11.5–15.5)
WBC: 6.1 10*3/uL (ref 4.0–10.5)

## 2015-12-25 LAB — POCT URINALYSIS DIP (MANUAL ENTRY)
BILIRUBIN UA: NEGATIVE
BILIRUBIN UA: NEGATIVE
Glucose, UA: NEGATIVE
Leukocytes, UA: NEGATIVE
Nitrite, UA: NEGATIVE
PH UA: 6
PROTEIN UA: NEGATIVE
RBC UA: NEGATIVE
SPEC GRAV UA: 1.025
Urobilinogen, UA: 0.2

## 2015-12-25 LAB — COMPREHENSIVE METABOLIC PANEL
ALT: 33 U/L (ref 9–46)
AST: 22 U/L (ref 10–35)
Albumin: 4.1 g/dL (ref 3.6–5.1)
Alkaline Phosphatase: 64 U/L (ref 40–115)
BUN: 11 mg/dL (ref 7–25)
CHLORIDE: 104 mmol/L (ref 98–110)
CO2: 26 mmol/L (ref 20–31)
CREATININE: 0.73 mg/dL (ref 0.70–1.33)
Calcium: 9.1 mg/dL (ref 8.6–10.3)
Glucose, Bld: 106 mg/dL — ABNORMAL HIGH (ref 65–99)
Potassium: 3.9 mmol/L (ref 3.5–5.3)
SODIUM: 140 mmol/L (ref 135–146)
Total Bilirubin: 1.1 mg/dL (ref 0.2–1.2)
Total Protein: 6.9 g/dL (ref 6.1–8.1)

## 2015-12-25 LAB — HEMOGLOBIN A1C
Hgb A1c MFr Bld: 4.8 % (ref <5.7)
Mean Plasma Glucose: 91 mg/dL (ref <117)

## 2015-12-25 LAB — HIV ANTIBODY (ROUTINE TESTING W REFLEX): HIV: NONREACTIVE

## 2015-12-25 LAB — LIPID PANEL
CHOL/HDL RATIO: 4.9 ratio (ref <=5.0)
Cholesterol: 127 mg/dL (ref 125–200)
HDL: 26 mg/dL — AB (ref >=40)
LDL CALC: 68 mg/dL (ref <130)
Triglycerides: 166 mg/dL — ABNORMAL HIGH (ref <150)
VLDL: 33 mg/dL — ABNORMAL HIGH (ref <30)

## 2015-12-25 LAB — POCT INFLUENZA A/B
INFLUENZA A, POC: NEGATIVE
INFLUENZA B, POC: NEGATIVE

## 2015-12-25 LAB — TSH: TSH: 1.92 mIU/L (ref 0.40–4.50)

## 2015-12-25 MED ORDER — HYDROCORTISONE ACETATE 25 MG RE SUPP: RECTAL | Status: DC

## 2015-12-25 MED ORDER — DESONIDE 0.05 % EX CREA: TOPICAL_CREAM | Freq: Two times a day (BID) | CUTANEOUS | Status: DC

## 2015-12-25 MED ORDER — VALACYCLOVIR HCL 500 MG PO TABS: ORAL_TABLET | ORAL | Status: DC

## 2015-12-25 MED ORDER — ATENOLOL 50 MG PO TABS: 50.0000 mg | ORAL_TABLET | Freq: Every day | ORAL | Status: DC

## 2015-12-25 MED ORDER — KETOCONAZOLE 2 % EX SHAM: 1.0000 "application " | MEDICATED_SHAMPOO | CUTANEOUS | Status: DC

## 2015-12-25 MED ORDER — FLUTICASONE PROPIONATE 50 MCG/ACT NA SUSP: 2.0000 | Freq: Every day | NASAL | Status: DC

## 2015-12-25 MED ORDER — ENALAPRIL MALEATE 20 MG PO TABS: 20.0000 mg | ORAL_TABLET | Freq: Every day | ORAL | Status: DC

## 2015-12-25 NOTE — Progress Notes (Signed)
Subjective:    Patient ID: Julian Bates, male    DOB: September 30, 1966, 50 y.o.   MRN: [id number]  12/25/2015  Annual Exam   HPI This 50 y.o. male presents for Annual Wellness Examination.  Last physical:  07-25-2014 Colonoscopy:  05-2015 TDAP:  2012 Influenza:  10/04/2015 Eye exam: Dental exam:   Flu symptoms: onset four days ago.  Cutting grass that day.  +fever; +chills/sweats.  +achy.  +SOB; +coughing; +wheezing. No mucous.  +HA; no ear pain.  +ST initially but not now.  +rhinorrhea; no n/v/d.  Nyquil am and pm.  Working.    [place] is GREEN.    Review of Systems  Constitutional: Negative for fever, chills, diaphoresis, activity change, appetite change, fatigue and unexpected weight change.  HENT: Negative for congestion, dental problem, drooling, ear discharge, ear pain, facial swelling, hearing loss, mouth sores, nosebleeds, postnasal drip, rhinorrhea, sinus pressure, sneezing, sore throat, tinnitus, trouble swallowing and voice change.   Eyes: Negative for photophobia, pain, discharge, redness, itching and visual disturbance.  Respiratory: Negative for apnea, cough, choking, chest tightness, shortness of breath, wheezing and stridor.   Cardiovascular: Negative for chest pain, palpitations and leg swelling.  Gastrointestinal: Negative for nausea, vomiting, abdominal pain, diarrhea, constipation and blood in stool.  Endocrine: Negative for cold intolerance, heat intolerance, polydipsia, polyphagia and polyuria.  Genitourinary: Negative for dysuria, urgency, frequency, hematuria, flank pain, decreased urine volume, discharge, penile swelling, scrotal swelling, enuresis, difficulty urinating, genital sores, penile pain and testicular pain.  Musculoskeletal: Negative for myalgias, back pain, joint swelling, arthralgias, gait problem, neck pain and neck stiffness.  Skin: Negative for color change, pallor, rash and wound.  Allergic/Immunologic: Negative for environmental  allergies, food allergies and immunocompromised state.  Neurological: Negative for dizziness, tremors, seizures, syncope, facial asymmetry, speech difficulty, weakness, light-headedness, numbness and headaches.  Hematological: Negative for adenopathy. Does not bruise/bleed easily.  Psychiatric/Behavioral: Negative for suicidal ideas, hallucinations, behavioral problems, confusion, sleep disturbance, self-injury, dysphoric mood, decreased concentration and agitation. The patient is not nervous/anxious and is not hyperactive.     Past Medical History  Diagnosis Date  . Hypertension   . Seborrhea   . Allergic conjunctivitis   . Arthritis   . Thrombocytopenia (HCC)   . Degenerative arthritis 04/20/2013  . Cataract   . NAFLD (nonalcoholic fatty liver disease) 1/47/8295   Past Surgical History  Procedure Laterality Date  . Hernia repair      x 3  . Colonoscopy    . Hemorrhoid banding     Allergies  Allergen Reactions  . Tramadol Nausea Only   Current Outpatient Prescriptions  Medication Sig Dispense Refill  . atenolol (TENORMIN) 50 MG tablet Take 1 tablet (50 mg total) by mouth daily. 90 tablet 3  . desonide (DESOWEN) 0.05 % cream Apply topically 2 (two) times daily. 30 g 3  . enalapril (VASOTEC) 20 MG tablet Take 1 tablet (20 mg total) by mouth daily. 90 tablet 3  . Fish Oil-Cholecalciferol (OMEGA-3 FISH OIL/VITAMIN D3 PO) Take by mouth.    . fluticasone (FLONASE) 50 MCG/ACT nasal spray Place 2 sprays into both nostrils daily. 16 g 6  . ketoconazole (NIZORAL) 2 % shampoo Apply 1 application topically 2 (two) times a week. 120 mL 11  . valACYclovir (VALTREX) 500 MG tablet TAKE ONE CAPLET BY MOUTH ONCE DAILY 90 tablet 3  . Wheat Dextrin (BENEFIBER DRINK MIX PO) Take by mouth.    . hydrocortisone (ANUCORT-HC) 25 MG suppository INSERT ONE SUPPOSITORY RECTALLY TWICE DAILY  24 suppository 1  . hydrocortisone cream 1 % Apply 1 application topically 2 (two) times daily. 30 g 0   No current  facility-administered medications for this visit.   Social History   Social History  . Marital Status: Married    Spouse Name: N/A  . Number of Children: N/A  . Years of Education: N/A   Occupational History  .                                                                                                                                                                                                                                                                    Social History Main Topics  . Smoking status: Never Smoker   . Smokeless tobacco: Never Used  . Alcohol Use: 0.0 oz/week    0 Standard drinks or equivalent per week     Comment: rarely drinks due to Red blood cell being low  . Drug Use: No  . Sexual Activity: Yes   Other Topics Concern  . Not on file   Social History Narrative   Marital status: married x 16 years.  From [place] moved to [place] in 2008.      Children:  4 children; 1 stepchild.  Six grandchildren.      Lives:  With wife #2, dog.      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  Moderately happy.      Tobacco: none      Alcohol: previous heavy use daily.        Drugs: none       Exercise:  None;        Seatbelt:  100% of time.        Family History  Problem Relation Age of Onset  . Arthritis Mother   . Diabetes Mother   . Hypertension Mother   . Arthritis Father   . Prostate cancer Father   . Stroke Father     CVA x 5  . Hypertension Father   . Diabetes Father     with leg ampuation  . Diabetes Brother   . HIV Brother   . Colon cancer Maternal Grandfather        Objective:    BP  128/82 mmHg  Pulse 74  Temp(Src) 98 F (36.7 C) (Oral)  Resp 16  Ht  (1.651 m)  Wt 153 lb (69.4 kg)  BMI 25.46 kg/m2 Physical Exam  Constitutional: He is oriented to person, place, and time. He appears well-developed and well-nourished. No distress.  HENT:  Head: Normocephalic and atraumatic.  Right Ear: External ear normal.   Left Ear: External ear normal.  Nose: Nose normal.  Mouth/Throat: Oropharynx is clear and moist.  Eyes: Conjunctivae and EOM are normal. Pupils are equal, round, and reactive to light.  Neck: Normal range of motion. Neck supple. Carotid bruit is not present. No thyromegaly present.  Cardiovascular: Normal rate, regular rhythm, normal heart sounds and intact distal pulses.  Exam reveals no gallop and no friction rub.   No murmur heard. Pulmonary/Chest: Effort normal and breath sounds normal. He has no wheezes. He has no rales.  Abdominal: Soft. Bowel sounds are normal. He exhibits no distension and no mass. There is no tenderness. There is no rebound and no guarding.  Musculoskeletal:       Right shoulder: Normal.       Left shoulder: Normal.       Cervical back: Normal.  Lymphadenopathy:    He has no cervical adenopathy.  Neurological: He is alert and oriented to person, place, and time. He has normal reflexes. No cranial nerve deficit. He exhibits normal muscle tone. Coordination normal.  Skin: Skin is warm and dry. No rash noted. He is not diaphoretic.  Psychiatric: He has a normal mood and affect. His behavior is normal. Judgment and thought content normal.        Assessment & Plan:   1. Routine physical examination   2. Essential hypertension, benign   3. Pure hypercholesterolemia   4. Cough   5. Screening for prostate cancer   6. Screening for HIV (human immunodeficiency virus)   7. Screening for diabetes mellitus   8. Seborrhea capitis   9. Seborrhea   10. Recurrent knee pain, right     Orders Placed This Encounter  Procedures  . DG Chest 2 View    Standing Status: Future     Number of Occurrences: 1     Standing Expiration Date: 12/24/2016    Order Specific Question:  Reason for Exam (SYMPTOM  OR DIAGNOSIS REQUIRED)    Answer:  cough, fever, SOB    Order Specific Question:  Preferred imaging location?    Answer:  External  . CBC with Differential/Platelet  .  Comprehensive metabolic panel    Order Specific Question:  Has the patient fasted?    Answer:  Yes  . Hemoglobin A1c  . Lipid panel    Order Specific Question:  Has the patient fasted?    Answer:  Yes  . TSH  . PSA  . HIV antibody  . Ambulatory referral to Orthopedic Surgery    Referral Priority:  Routine    Referral Type:  Surgical    Referral Reason:  Specialty Services Required    Requested Specialty:  Orthopedic Surgery    Number of Visits Requested:  1  . POCT Influenza A/B  . POCT urinalysis dipstick  . EKG 12-Lead   Meds ordered this encounter  Medications  . valACYclovir (VALTREX) 500 MG tablet    Sig: TAKE ONE CAPLET BY MOUTH ONCE DAILY    Dispense:  90 tablet    Refill:  3  . ketoconazole (NIZORAL) 2 % shampoo    Sig: Apply 1  application topically 2 (two) times a week.    Dispense:  120 mL    Refill:  11  . fluticasone (FLONASE) 50 MCG/ACT nasal spray    Sig: Place 2 sprays into both nostrils daily.    Dispense:  16 g    Refill:  6  . enalapril (VASOTEC) 20 MG tablet    Sig: Take 1 tablet (20 mg total) by mouth daily.    Dispense:  90 tablet    Refill:  3  . desonide (DESOWEN) 0.05 % cream    Sig: Apply topically 2 (two) times daily.    Dispense:  30 g    Refill:  3  . atenolol (TENORMIN) 50 MG tablet    Sig: Take 1 tablet (50 mg total) by mouth daily.    Dispense:  90 tablet    Refill:  3  . hydrocortisone (ANUCORT-HC) 25 MG suppository    Sig: INSERT ONE SUPPOSITORY RECTALLY TWICE DAILY    Dispense:  24 suppository    Refill:  1    Return in about 6 months (around 06/23/2016) for recheck blood pressure.    Julian Bates, M.D. [facility] [address] [place], [place]  [address] [phone] phone [phone] fax

## 2015-12-25 NOTE — Patient Instructions (Addendum)
Because you received an x-ray today, you will receive an invoice from [facility]. Please contact [facility] at [phone] with questions or concerns regarding your invoice. Our billing staff will not be able to assist you with those questions.  Keeping you healthy  Get these tests  Blood pressure- Have your blood pressure checked once a year by your healthcare provider.  Normal blood pressure is 120/80  Weight- Have your body mass index (BMI) calculated to screen for obesity.  BMI is a measure of body fat based on height and weight. You can also calculate your own BMI at [email or web address].  Cholesterol- Have your cholesterol checked every year.  Diabetes- Have your blood sugar checked regularly if you have high blood pressure, high cholesterol, have a family history of diabetes or if you are overweight.  Screening for Colon Cancer- Colonoscopy starting at age 79.  Screening may begin sooner depending on your family history and other health conditions. Follow up colonoscopy as directed by your Gastroenterologist.  Screening for Prostate Cancer- Both blood work (PSA) and a rectal exam help screen for Prostate Cancer.  Screening begins at age 23 with African-American men and at age 48 with Caucasian men.  Screening may begin sooner depending on your family history.  Take these medicines  Aspirin- One aspirin daily can help prevent Heart disease and Stroke.  Flu shot- Every fall.  Tetanus- Every 10 years.  Zostavax- Once after the age of 62 to prevent Shingles.  Pneumonia shot- Once after the age of 50; if you are younger than 39, ask your healthcare provider if you need a Pneumonia shot.  Take these steps  Don't smoke- If you do smoke, talk to your doctor about quitting.  For tips on how to quit, go to www.smokefree.gov or call 1-800-QUIT-NOW.  Be physically active- Exercise 5 days a week for at least 30 minutes.  If you are not already physically active  start slow and gradually work up to 30 minutes of moderate physical activity.  Examples of moderate activity include walking briskly, mowing the yard, dancing, swimming, bicycling, etc.  Eat a healthy diet- Eat a variety of healthy food such as fruits, vegetables, low fat milk, low fat cheese, yogurt, lean meant, poultry, fish, beans, tofu, etc. For more information go to www.thenutritionsource.org  Drink alcohol in moderation- Limit alcohol intake to less than two drinks a day. Never drink and drive.  Dentist- Brush and floss twice daily; visit your dentist twice a year.  Depression- Your emotional health is as important as your physical health. If you're feeling down, or losing interest in things you would normally enjoy please talk to your healthcare provider.  Eye exam- Visit your eye doctor every year.  Safe sex- If you may be exposed to a sexually transmitted infection, use a condom.  Seat belts- Seat belts can save your life; always wear one.  Smoke/Carbon Monoxide detectors- These detectors need to be installed on the appropriate level of your home.  Replace batteries at least once a year.  Skin cancer- When out in the sun, cover up and use sunscreen 15 SPF or higher.  Violence- If anyone is threatening you, please tell your healthcare provider. Living Will/ Health care power of attorney- Speak with your healthcare provider and family.

## 2015-12-26 ENCOUNTER — Telehealth: Payer: Self-pay

## 2015-12-26 LAB — PSA: PSA: 0.24 ng/mL (ref <=4.00)

## 2015-12-26 NOTE — Telephone Encounter (Signed)
Fax from pharm advises desonide cream is not covered by pt's ins. They are requesting an alternative such as hydrocortisone.

## 2015-12-30 ENCOUNTER — Encounter: Payer: Managed Care, Other (non HMO) | Admitting: Family Medicine

## 2015-12-30 MED ORDER — HYDROCORTISONE 1 % EX CREA: 1.0000 "application " | TOPICAL_CREAM | Freq: Two times a day (BID) | CUTANEOUS | Status: DC

## 2015-12-30 NOTE — Telephone Encounter (Signed)
Hydrocortisone cream 1% sent to pharmacy.  

## 2016-01-05 ENCOUNTER — Encounter: Payer: Self-pay | Admitting: Family Medicine

## 2016-02-03 ENCOUNTER — Ambulatory Visit: Payer: Managed Care, Other (non HMO) | Admitting: Internal Medicine

## 2016-02-14 ENCOUNTER — Other Ambulatory Visit: Payer: Self-pay | Admitting: Family Medicine

## 2016-04-02 ENCOUNTER — Ambulatory Visit (INDEPENDENT_AMBULATORY_CARE_PROVIDER_SITE_OTHER): Payer: Managed Care, Other (non HMO) | Admitting: Family Medicine

## 2016-04-02 DIAGNOSIS — Z23 Encounter for immunization: Secondary | ICD-10-CM | POA: Diagnosis not present

## 2016-05-08 NOTE — Progress Notes (Signed)
No provider encounter; immunization encounter only. S/p Hepatitis A#2 and Hepatitis B#3.

## 2016-06-23 ENCOUNTER — Ambulatory Visit: Payer: Managed Care, Other (non HMO) | Admitting: Family Medicine

## 2016-07-22 ENCOUNTER — Ambulatory Visit: Payer: Managed Care, Other (non HMO) | Admitting: Family Medicine

## 2016-08-12 ENCOUNTER — Ambulatory Visit: Payer: Managed Care, Other (non HMO)

## 2016-12-29 ENCOUNTER — Encounter: Payer: Managed Care, Other (non HMO) | Admitting: Family Medicine

## 2016-12-29 ENCOUNTER — Other Ambulatory Visit: Payer: Self-pay | Admitting: Family Medicine

## 2017-01-20 ENCOUNTER — Encounter: Payer: Self-pay | Admitting: Family Medicine

## 2017-01-20 ENCOUNTER — Ambulatory Visit (INDEPENDENT_AMBULATORY_CARE_PROVIDER_SITE_OTHER): Payer: Managed Care, Other (non HMO) | Admitting: Family Medicine

## 2017-01-20 VITALS — BP 134/78 | HR 65 | Temp 97.7°F | Ht 65.0 in | Wt 151.4 lb

## 2017-01-20 DIAGNOSIS — Z131 Encounter for screening for diabetes mellitus: Secondary | ICD-10-CM | POA: Diagnosis not present

## 2017-01-20 DIAGNOSIS — I10 Essential (primary) hypertension: Secondary | ICD-10-CM

## 2017-01-20 DIAGNOSIS — Z23 Encounter for immunization: Secondary | ICD-10-CM

## 2017-01-20 DIAGNOSIS — K760 Fatty (change of) liver, not elsewhere classified: Secondary | ICD-10-CM

## 2017-01-20 DIAGNOSIS — Z125 Encounter for screening for malignant neoplasm of prostate: Secondary | ICD-10-CM | POA: Diagnosis not present

## 2017-01-20 DIAGNOSIS — N529 Male erectile dysfunction, unspecified: Secondary | ICD-10-CM

## 2017-01-20 DIAGNOSIS — Z13220 Encounter for screening for lipoid disorders: Secondary | ICD-10-CM

## 2017-01-20 DIAGNOSIS — H9202 Otalgia, left ear: Secondary | ICD-10-CM | POA: Diagnosis not present

## 2017-01-20 DIAGNOSIS — Z0000 Encounter for general adult medical examination without abnormal findings: Secondary | ICD-10-CM

## 2017-01-20 DIAGNOSIS — L210 Seborrhea capitis: Secondary | ICD-10-CM

## 2017-01-20 DIAGNOSIS — L219 Seborrheic dermatitis, unspecified: Secondary | ICD-10-CM | POA: Diagnosis not present

## 2017-01-20 LAB — POCT URINALYSIS DIP (MANUAL ENTRY)
BILIRUBIN UA: NEGATIVE
Bilirubin, UA: NEGATIVE
Blood, UA: NEGATIVE
Glucose, UA: NEGATIVE
LEUKOCYTES UA: NEGATIVE
NITRITE UA: NEGATIVE
PROTEIN UA: NEGATIVE
Spec Grav, UA: 1.005
Urobilinogen, UA: 1
pH, UA: 8.5 — AB

## 2017-01-20 MED ORDER — SILDENAFIL CITRATE 100 MG PO TABS: 100.0000 mg | ORAL_TABLET | Freq: Every day | ORAL | 5 refills | Status: DC | PRN

## 2017-01-20 MED ORDER — HYDROCORTISONE ACETATE 25 MG RE SUPP: RECTAL | 1 refills | Status: AC

## 2017-01-20 MED ORDER — FLUTICASONE PROPIONATE 50 MCG/ACT NA SUSP: 2.0000 | Freq: Every day | NASAL | 6 refills | Status: DC

## 2017-01-20 MED ORDER — KETOCONAZOLE 2 % EX SHAM: 1.0000 "application " | MEDICATED_SHAMPOO | CUTANEOUS | 11 refills | Status: DC

## 2017-01-20 MED ORDER — ENALAPRIL MALEATE 20 MG PO TABS: 20.0000 mg | ORAL_TABLET | Freq: Every day | ORAL | 3 refills | Status: DC

## 2017-01-20 MED ORDER — FLUOCINOLONE ACETONIDE 0.01 % OT OIL: 1.0000 "application " | TOPICAL_OIL | Freq: Every day | OTIC | 0 refills | Status: DC | PRN

## 2017-01-20 MED ORDER — DESONIDE 0.05 % EX CREA: TOPICAL_CREAM | Freq: Two times a day (BID) | CUTANEOUS | 3 refills | Status: DC

## 2017-01-20 MED ORDER — VALACYCLOVIR HCL 500 MG PO TABS: ORAL_TABLET | ORAL | 3 refills | Status: DC

## 2017-01-20 MED ORDER — ATENOLOL 50 MG PO TABS: 50.0000 mg | ORAL_TABLET | Freq: Every day | ORAL | 3 refills | Status: DC

## 2017-01-20 NOTE — Patient Instructions (Addendum)
IF you received an x-ray today, you will receive an invoice from [facility]. Please contact [facility] at [phone] with questions or concerns regarding your invoice.   IF you received labwork today, you will receive an invoice from [facility]. Please contact [facility] at [phone] with questions or concerns regarding your invoice.   Our billing staff will not be able to assist you with questions regarding bills from these companies.  You will be contacted with the lab results as soon as they are available. The fastest way to get your results is to activate your My Chart account. Instructions are located on the last page of this paperwork. If you have not heard from [place] regarding the results in 2 weeks, please contact this office.      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 Haga los ejercicios exactamente como se lo haya indicado el mdico y gradelos como se lo hayan indicado. Es normal sentir tirantez, tensin, presin o molestias leves mientras hace estos ejercicios, pero debe detenerse de inmediato si siente dolor repentino o si el dolor empeora.No comience a hacer estos ejercicios hasta que se lo indique el mdico. EJERCICIO DE [employer] Y AMPLITUD DE MOVIMIENTOS  Este ejercicio calienta los msculos y las articulaciones, y mejora la movilidad y la flexibilidad del hombro. Adems, ayuda a Engineer, materials y la rigidez.  EjercicioA: Aduccin horizontal, pasivo  1. Sintese o prese, y lleve el brazo izquierdo/derecho con el codo extendido [employer] hombro opuesto, cruzando el pecho. Detngase cuando sienta una elongacin suave atrs del hombro y la parte superior del brazo.  Mantenga el brazo a la altura del hombro.  Mantenga el brazo lo ms cerca que pueda de su cuerpo, sin Cabin crew. 1. Mantenga esta posicin durante __________  segundos. 2. Vuelva lentamente a la posicin inicial. Repita __________ veces. Realice este ejercicio __________ veces al da. EJERCICIOS DE FORTALECIMIENTO  Estos ejercicios fortalecen el hombro y le otorgan resistencia. La resistencia es la capacidad de usar los msculos durante un tiempo prolongado, incluso despus de que se cansen. EjercicioB: Rotacin externa, isometra  1. Prese o sintese en la entrada de [place], de frente al marco de la puerta. 2. Flexione el codo izquierdo/derecho y Systems analyst de atrs de la mueca contra el marco de la [place]. Solo la [employer] debe tocar el marco. Mantenga la parte superior del brazo al costado del cuerpo. 3. Presione suavemente la [employer] contra el marco de la [place], como si tratara de empujar el brazo en direccin contraria al abdomen.  Evite encoger el hombro mientras presiona con la mano sobre el marco de la [place]. Mantenga los omplatos juntos, llvelos hacia el centro de la espalda. 1. Mantenga esta posicin durante __________ segundos. 2. Afloje lentamente la tensin y relaje los msculos por completo antes de repetir el ejercicio. Repita __________ veces. Realice este ejercicio __________ veces al da.  EjercicioC: Rotacin interna, isometra  1. Prese o sintese en la entrada de [place], de frente al marco de la puerta. 2. Flexione el codo izquierdo/derecho y Systems analyst interna de la mueca contra el marco de la [place]. Solo la [employer] debe tocar el marco. Mantenga la parte superior del brazo al costado del cuerpo. 3. Presione suavemente la [employer] contra el marco de la [place], como si tratara de empujar el brazo hacia el abdomen.  Evite encoger el hombro mientras presiona  con la Auto-Owners Insurancemano sobre el marco de la [place]. Mantenga los omplatos juntos, llvelos hacia el centro de la espalda. 1. Mantenga esta posicin durante __________ segundos. 2. Afloje lentamente la tensin y relaje los msculos por completo antes de repetir el  ejercicio. Repita __________ veces. Realice este ejercicio __________ veces al da.  EjercicioD: Protraccin escapular, decbito supino  1. Acustese boca arriba sobre una superficie firme. Sostenga una pesa de __________ con la mano izquierda/derecha. 2. Eleve el brazo izquierdo/derecho en el aire, de modo que la mano est directamente por encima de la articulacin del hombro. 3. Empuje con la pesa en el aire de forma que el hombro se levante de la superficie sobre la que est recostado. No mueva la cabeza, el cuello ni la espalda. 4. Mantenga esta posicin durante __________ segundos. 5. Vuelva lentamente a la posicin inicial. Relaje totalmente los msculos antes de repetir el ejercicio. Repita __________ veces. Realice este ejercicio __________ veces al da.  EjercicioE: Retraccin escapular  1. Sintese en una silla estable que no tenga apoyabrazos o pngase de pie. 2. Ate una banda para ejercicios a un objeto estable que est frente a usted, de modo que la banda est a la altura del hombro. 3. Sostenga un extremo de la banda para ejercicios en cada mano. Las palmas deben estar Phoebe Sharpshacia abajo. 4. Junte los omplatos y Elizavillemueva los codos ligeramente hacia atrs. No encoja los hombros al hacerlo. 5. Mantenga esta posicin durante __________ segundos. 6. Vuelva lentamente a la posicin inicial. Repita __________ veces. Realice este ejercicio __________ veces al da. EjercicioF: Extensin del hombro  1. Sintese en una silla estable que no tenga apoyabrazos o pngase de pie. 2. Ate una banda para ejercicios a un objeto estable que est frente a usted, de modo que la banda est por encima de la altura del hombro. 3. Sostenga un extremo de la banda para ejercicios en cada mano. 4. Extienda los codos y U.S. Bancorplevante las manos a la altura de los hombros. 5. Junte los omplatos y baje las manos hacia los costados de los muslos. Detngase cuando las manos estn en la misma posicin en ambos costados. No deje  que las manos vayan hacia atrs del cuerpo. 6. Mantenga esta posicin durante __________ segundos. 7. Vuelva lentamente a la posicin inicial. Repita __________ veces. Realice este ejercicio __________ veces al da. Esta informacin no tiene Theme park managercomo fin reemplazar el consejo del mdico. Asegrese de hacerle al mdico cualquier pregunta que tenga. Document Released: 08/12/2006 Document Revised: 07/17/2015 Document Reviewed: 09/28/2015 Elsevier Interactive Patient Education  2017 [employer]

## 2017-01-20 NOTE — Progress Notes (Signed)
Subjective:    Patient ID: Julian Bates, male    DOB: 1966-11-07, 51 y.o.   MRN: [id number]  01/20/2017  Annual Exam (CPE)   HPI This 51 y.o. male presents for Complete Physical Examination.  Last physical:  12-25-2015 Colonoscopy:  2016 PSA: 2017   Immunization History  Administered Date(s) Administered  . Hepatitis A, Adult 10/04/2015, 04/02/2016  . Hepatitis B, adult 10/04/2015, 11/05/2015, 04/02/2016  . Influenza,inj,Quad PF,36+ Mos 01/06/2014, 07/25/2014, 10/04/2015    BP Readings from Last 3 Encounters:  01/20/17 134/78  12/25/15 128/82  10/04/15 119/75   Wt Readings from Last 3 Encounters:  01/20/17 151 lb 6.4 oz (68.7 kg)  12/25/15 153 lb (69.4 kg)  10/04/15 158 lb 6.4 oz (71.8 kg)   L ear irritation/pain: continues to suffer with intermittent L ear irritation, pain, etc. Very bothered by symptoms; desires ENT consultation.  Denies ringing in ears.  Denies hearing loss.    Review of Systems  Constitutional: Negative for activity change, appetite change, chills, diaphoresis, fatigue, fever and unexpected weight change.  HENT: Positive for ear pain. Negative for congestion, dental problem, drooling, ear discharge, facial swelling, hearing loss, mouth sores, nosebleeds, postnasal drip, rhinorrhea, sinus pressure, sneezing, sore throat, tinnitus, trouble swallowing and voice change.   Eyes: Negative for photophobia, pain, discharge, redness, itching and visual disturbance.  Respiratory: Negative for apnea, cough, choking, chest tightness, shortness of breath, wheezing and stridor.   Cardiovascular: Negative for chest pain, palpitations and leg swelling.  Gastrointestinal: Negative for abdominal pain, blood in stool, constipation, diarrhea, nausea and vomiting.  Endocrine: Negative for cold intolerance, heat intolerance, polydipsia, polyphagia and polyuria.  Genitourinary: Negative for decreased urine volume, difficulty urinating, discharge, dysuria, enuresis,  flank pain, frequency, genital sores, hematuria, penile pain, penile swelling, scrotal swelling, testicular pain and urgency.       Nocturia x 2.  Urinary stream is weak and strong.  Urine stream splits chronic for years.  Musculoskeletal: Negative for arthralgias, back pain, gait problem, joint swelling, myalgias, neck pain and neck stiffness.  Skin: Negative for color change, pallor, rash and wound.  Allergic/Immunologic: Negative for environmental allergies, food allergies and immunocompromised state.  Neurological: Negative for dizziness, tremors, seizures, syncope, facial asymmetry, speech difficulty, weakness, light-headedness, numbness and headaches.  Hematological: Negative for adenopathy. Does not bruise/bleed easily.  Psychiatric/Behavioral: Negative for agitation, behavioral problems, confusion, decreased concentration, dysphoric mood, hallucinations, self-injury, sleep disturbance and suicidal ideas. The patient is not nervous/anxious and is not hyperactive.     Past Medical History:  Diagnosis Date  . Allergic conjunctivitis   . Arthritis   . Cataract   . Degenerative arthritis 04/20/2013  . Hypertension   . NAFLD (nonalcoholic fatty liver disease) 6/96/2952  . Seborrhea   . Thrombocytopenia (HCC)    Past Surgical History:  Procedure Laterality Date  . COLONOSCOPY    . HEMORRHOID BANDING    . HERNIA REPAIR     x 3   Allergies  Allergen Reactions  . Tramadol Nausea Only    Social History   Social History  . Marital status: Married    Spouse name: N/A  . Number of children: N/A  . Years of education: N/A   Occupational History  .  Engineered [employer]   Social History Main Topics  . Smoking status: Never Smoker    . Smokeless tobacco: Never Used  . Alcohol use 0.0 oz/week     Comment: rarely drinks due to Red blood cell being low  . Drug use: No  . Sexual activity: Yes    Birth control/ protection: Post-menopausal   Other Topics Concern  . Not on file   Social History Narrative   Marital status: married x 18 years.  From [place] moved to [place] in 2008.      Children:  4 children; 1 stepchild.  Six grandchildren.      Lives:  With wife #2, dog/Sasha.      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  Moderately happy.      Tobacco: none      Alcohol: previous heavy use daily. None in  November 2016.       Drugs: none       Exercise:  Gym daily in 2018.       Seatbelt:  100% of time; no texting while driving.        Family History  Problem Relation Age of Onset  . Arthritis Mother   . Diabetes Mother   . Hypertension Mother   . Arthritis Father   . Prostate cancer Father   . Stroke Father     CVA x 5  . Hypertension Father   . Diabetes Father     with leg ampuation  . Cancer Father 73    prostate cancer  . Diabetes Brother   . HIV Brother   . Colon cancer Maternal Grandfather        Objective:    BP 134/78 (BP Location: Right Arm, Patient Position: Sitting, Cuff Size: Small)   Pulse 65   Temp 97.7 F (36.5 C) (Oral)   Ht 5\' 5"  (1.651 m)   Wt 151 lb 6.4 oz (68.7 kg)   SpO2 100%   BMI 25.19 kg/m  Physical Exam  Constitutional: He is oriented to person, place, and time. He appears well-developed and well-nourished. No distress.  HENT:  Head: Normocephalic and atraumatic.  Right Ear: External ear normal.  Left Ear: External ear normal.  Nose: Nose normal.  Mouth/Throat: Oropharynx is clear and moist.  Eyes: Conjunctivae and EOM are normal. Pupils are equal, round, and reactive to light.  Neck: Normal range of motion. Neck supple. Carotid bruit is not present. No thyromegaly present.  Cardiovascular: Normal rate, regular rhythm, normal heart sounds  and intact distal pulses.  Exam reveals no gallop and no friction rub.   No murmur heard. Pulmonary/Chest: Effort normal and breath sounds normal. He has no wheezes. He has no rales.  Abdominal: Soft. Bowel sounds are normal. He exhibits no distension and no mass. There is no tenderness. There is no rebound and no guarding. Hernia confirmed negative in the right inguinal area and confirmed negative in the left inguinal area.  Genitourinary: Rectum normal, prostate normal, testes normal and penis normal.  Musculoskeletal:       Right shoulder: Normal.       Left shoulder: Normal.       Cervical back: Normal.  Lymphadenopathy:    He has no cervical adenopathy.       Right: No inguinal adenopathy present.       Left: No inguinal adenopathy present.  Neurological: He is alert and oriented to person, place, and time. He has normal reflexes. No cranial nerve  deficit. He exhibits normal muscle tone. Coordination normal.  Skin: Skin is warm and dry. No rash noted. He is not diaphoretic.  Psychiatric: He has a normal mood and affect. His behavior is normal. Judgment and thought content normal.   [facility] 2/[facility] 01/20/2017 12/25/2015 10/04/2015 07/25/2014  Decreased Interest 0 0 0 0  Down, Depressed, Hopeless 0 0 0 0  PHQ - 2 Score 0 0 0 0   Fall Risk  01/20/2017 12/25/2015 10/04/2015 07/25/2014  Falls in the past year? No No No No        Assessment & Plan:   1. Routine physical examination   2. NAFLD (nonalcoholic fatty liver disease)   3. Seborrhea   4. Essential hypertension, benign   5. Screening for prostate cancer   6. Screening for diabetes mellitus   7. Screening, lipid   8. Impotence   9. Seborrhea capitis   10. Acute otalgia, left   11. Need for prophylactic vaccination and inoculation against influenza   12. Need for Tdap vaccination    -anticipatory guidance provided -congratulations on weight loss and exercise. -obtain labs for chronic disease management and for  age appropriate screening. -s/p TDAP and influenza vaccines. -refer to ENT for L ear pain ongoing and intermittent. -refills provided.  Orders Placed This Encounter  Procedures  . Flu Vaccine QUAD 36+ mos IM  . Tdap vaccine greater than or equal to 7yo IM  . CBC with Differential/Platelet  . Comprehensive metabolic panel    Order Specific Question:   Has the patient fasted?    Answer:   Yes  . Hemoglobin A1c  . Lipid panel    Order Specific Question:   Has the patient fasted?    Answer:   Yes  . TSH  . PSA  . Ambulatory referral to ENT    Referral Priority:   Routine    Referral Type:   Consultation    Referral Reason:   Specialty Services Required    Requested Specialty:   Otolaryngology    Number of Visits Requested:   1  . POCT urinalysis dipstick  . EKG 12-Lead   Meds ordered this encounter  Medications  . sildenafil (VIAGRA) 100 MG tablet    Sig: Take 1 tablet (100 mg total) by mouth daily as needed for erectile dysfunction.    Dispense:  8 tablet    Refill:  5  . atenolol (TENORMIN) 50 MG tablet    Sig: Take 1 tablet (50 mg total) by mouth daily.    Dispense:  90 tablet    Refill:  3  . desonide (DESOWEN) 0.05 % cream    Sig: Apply topically 2 (two) times daily.    Dispense:  30 g    Refill:  3  . enalapril (VASOTEC) 20 MG tablet    Sig: Take 1 tablet (20 mg total) by mouth daily.    Dispense:  90 tablet    Refill:  3  . fluticasone (FLONASE) 50 MCG/ACT nasal spray    Sig: Place 2 sprays into both nostrils daily.    Dispense:  16 g    Refill:  6  . hydrocortisone (ANUCORT-HC) 25 MG suppository    Sig: INSERT ONE SUPPOSITORY RECTALLY TWICE DAILY    Dispense:  24 suppository    Refill:  1  . ketoconazole (NIZORAL) 2 % shampoo    Sig: Apply 1 application topically 2 (two) times a week.    Dispense:  120 mL  Refill:  11  . valACYclovir (VALTREX) 500 MG tablet    Sig: TAKE ONE (1) TABLET BY MOUTH EVERY DAY TOME UNA TABLETA POR LA BOCA CADA DIA     Dispense:  90 tablet    Refill:  3    Need office visit prior to refill  . Fluocinolone Acetonide 0.01 % OIL    Sig: Place 1 application in ear(s) daily as needed.    Dispense:  20 mL    Refill:  0    Return in about 6 months (around 07/23/2017) for recheck HIGH BLOO PRESSURE.   Geoffery Aultman Paulita Fujita, M.D. Primary Care at [facility] previously [facility] [address] [place], [place]  [address] [phone] phone [phone] fax

## 2017-01-21 LAB — COMPREHENSIVE METABOLIC PANEL
A/G RATIO: 1.8 (ref 1.2–2.2)
ALBUMIN: 4.7 g/dL (ref 3.5–5.5)
ALK PHOS: 62 IU/L (ref 39–117)
ALT: 29 IU/L (ref 0–44)
AST: 22 IU/L (ref 0–40)
BILIRUBIN TOTAL: 0.9 mg/dL (ref 0.0–1.2)
BUN/Creatinine Ratio: 19 (ref 9–20)
BUN: 16 mg/dL (ref 6–24)
CO2: 26 mmol/L (ref 18–29)
CREATININE: 0.85 mg/dL (ref 0.76–1.27)
Calcium: 9.5 mg/dL (ref 8.7–10.2)
Chloride: 99 mmol/L (ref 96–106)
GFR calc Af Amer: 117 mL/min/{1.73_m2} (ref >59)
GFR, EST NON AFRICAN AMERICAN: 101 mL/min/{1.73_m2} (ref >59)
Globulin, Total: 2.6 g/dL (ref 1.5–4.5)
Glucose: 99 mg/dL (ref 65–99)
POTASSIUM: 4 mmol/L (ref 3.5–5.2)
Sodium: 139 mmol/L (ref 134–144)
TOTAL PROTEIN: 7.3 g/dL (ref 6.0–8.5)

## 2017-01-21 LAB — CBC WITH DIFFERENTIAL/PLATELET
BASOS: 0 %
Basophils Absolute: 0 10*3/uL (ref 0.0–0.2)
EOS (ABSOLUTE): 0.1 10*3/uL (ref 0.0–0.4)
Eos: 1 %
Hematocrit: 47.9 % (ref 37.5–51.0)
Hemoglobin: 17 g/dL (ref 13.0–17.7)
IMMATURE GRANS (ABS): 0 10*3/uL (ref 0.0–0.1)
Immature Granulocytes: 0 %
LYMPHS ABS: 1.7 10*3/uL (ref 0.7–3.1)
LYMPHS: 22 %
MCH: 31.8 pg (ref 26.6–33.0)
MCHC: 35.5 g/dL (ref 31.5–35.7)
MCV: 90 fL (ref 79–97)
Monocytes Absolute: 0.6 10*3/uL (ref 0.1–0.9)
Monocytes: 8 %
NEUTROS ABS: 5.1 10*3/uL (ref 1.4–7.0)
Neutrophils: 69 %
PLATELETS: 137 10*3/uL — AB (ref 150–379)
RBC: 5.34 x10E6/uL (ref 4.14–5.80)
RDW: 14 % (ref 12.3–15.4)
WBC: 7.6 10*3/uL (ref 3.4–10.8)

## 2017-01-21 LAB — PSA: PROSTATE SPECIFIC AG, SERUM: 0.3 ng/mL (ref 0.0–4.0)

## 2017-01-21 LAB — LIPID PANEL
CHOLESTEROL TOTAL: 138 mg/dL (ref 100–199)
Chol/HDL Ratio: 4.1 ratio units (ref 0.0–5.0)
HDL: 34 mg/dL — ABNORMAL LOW (ref >39)
LDL Calculated: 71 mg/dL (ref 0–99)
Triglycerides: 166 mg/dL — ABNORMAL HIGH (ref 0–149)
VLDL CHOLESTEROL CAL: 33 mg/dL (ref 5–40)

## 2017-01-21 LAB — HEMOGLOBIN A1C
ESTIMATED AVERAGE GLUCOSE: 94 mg/dL
Hgb A1c MFr Bld: 4.9 % (ref 4.8–5.6)

## 2017-01-21 LAB — TSH: TSH: 2.79 u[IU]/mL (ref 0.450–4.500)

## 2017-02-09 ENCOUNTER — Telehealth: Payer: Self-pay | Admitting: Family Medicine

## 2017-02-09 NOTE — Telephone Encounter (Signed)
Pt calling about lab results please call wife about his labs so she can give him his results

## 2017-02-09 NOTE — Telephone Encounter (Signed)
See labs and advise 

## 2017-02-10 NOTE — Telephone Encounter (Signed)
Call --- labs are all normal except for slightly low platelet count of 137,000 which is stable.  Cholesterol is normal.  Sugar is normal. Prostate is normal. Liver and kidney functions are normal.  Urine is normal.

## 2017-02-11 NOTE — Telephone Encounter (Signed)
Pt advised.

## 2017-06-24 ENCOUNTER — Other Ambulatory Visit: Payer: Self-pay | Admitting: Family Medicine

## 2017-06-24 ENCOUNTER — Telehealth: Payer: Self-pay | Admitting: Family Medicine

## 2017-06-24 ENCOUNTER — Other Ambulatory Visit: Payer: Self-pay

## 2017-06-24 DIAGNOSIS — I10 Essential (primary) hypertension: Secondary | ICD-10-CM

## 2017-06-24 MED ORDER — ATENOLOL 50 MG PO TABS: 50.0000 mg | ORAL_TABLET | Freq: Every day | ORAL | 3 refills | Status: DC

## 2017-06-24 MED ORDER — ENALAPRIL MALEATE 20 MG PO TABS: 20.0000 mg | ORAL_TABLET | Freq: Every day | ORAL | 3 refills | Status: DC

## 2017-06-24 NOTE — Telephone Encounter (Signed)
Pt has requested a refill on his high blood pressure medication and he is leaving to go out of town and needs this as soon as possible   Best [phone]

## 2017-06-24 NOTE — Telephone Encounter (Signed)
Refills sent to pt pharmacy. 

## 2017-06-25 ENCOUNTER — Other Ambulatory Visit: Payer: Self-pay

## 2017-06-25 DIAGNOSIS — I10 Essential (primary) hypertension: Secondary | ICD-10-CM

## 2017-06-25 MED ORDER — ATENOLOL 50 MG PO TABS: 50.0000 mg | ORAL_TABLET | Freq: Every day | ORAL | 3 refills | Status: DC

## 2017-06-25 MED ORDER — ENALAPRIL MALEATE 20 MG PO TABS
20.0000 mg | ORAL_TABLET | Freq: Every day | ORAL | 3 refills | Status: DC
Start: 2017-06-25 — End: 2017-08-03

## 2017-07-26 NOTE — Progress Notes (Signed)
Subjective:    Patient ID: Julian Bates, male    DOB: October 04, 1966, 51 y.o.   MRN: [id number]  07/27/2017  Hypertension (6 month follow-up) and Shoulder Pain (x 2 months right shoulder)   HPI This 51 y.o. male presents for six month follow-up of hypertension, hyperlipidemia, seborrhea dermatitis.  No management changes made at last visit.  Labs stable.  Referred to ENT due to ongoing chronic LEFT ear pain. Just returned from [place]  One week duration; mother in [place] and 3 brothers.  Went to fix house in [place] roofing needed Psychologist, forensic.  Tries to keep house fixed.  Might return to [place] upon retirement.  [place]  Renting home; right now closed; needs to go back next year; needs to pain it.   Down four pounds.  Only eating fish, salads, herbal life shakes for past 1.5 years.  Goes to gym every day.  Wife does not go daily now due to lower back injury.  Just went back to work two months ago.    Rash on neck and chest: onset two weeks ago; severe; intermittent issue.  Chronic intermittent issue.   Topical steroid helps some.   Usually occurs with prolonged sun and/or heat exposure.  Wears sunscreen.  R shoulder pain: onset two months ago.  Pain in posterior and front.  Pain with elevation.  Weight lifting at work.  Cannot do push-ups due to R anterior shoulder pain.  Certain exercises hurts; nighttime pain also.  No medication.   Continues to have ongoing marital strain; never went for psychotherapy; not sleeping in the same room; argue a lot.   BP Readings from Last 3 Encounters:  07/27/17 118/78  01/20/17 134/78  12/25/15 128/82   Wt Readings from Last 3 Encounters:  07/27/17 147 lb (66.7 kg)  01/20/17 151 lb 6.4 oz (68.7 kg)  12/25/15 153 lb (69.4 kg)   Immunization History  Administered Date(s) Administered  . Hepatitis A, Adult 10/04/2015, 04/02/2016  . Hepatitis B, adult 10/04/2015, 11/05/2015, 04/02/2016  . Influenza,inj,Quad PF,6+ Mos  01/06/2014, 07/25/2014, 10/04/2015, 01/20/2017, 07/27/2017  . Tdap 01/20/2017    Review of Systems  Constitutional: Negative for activity change, appetite change, chills, diaphoresis, fatigue and fever.  Respiratory: Negative for cough and shortness of breath.   Cardiovascular: Negative for chest pain, palpitations and leg swelling.  Gastrointestinal: Negative for abdominal pain, diarrhea, nausea and vomiting.  Endocrine: Negative for cold intolerance, heat intolerance, polydipsia, polyphagia and polyuria.  Musculoskeletal: Positive for arthralgias.  Skin: Positive for rash. Negative for color change and wound.  Neurological: Negative for dizziness, tremors, seizures, syncope, facial asymmetry, speech difficulty, weakness, light-headedness, numbness and headaches.  Psychiatric/Behavioral: Negative for dysphoric mood and sleep disturbance. The patient is not nervous/anxious.     Past Medical History:  Diagnosis Date  . Allergic conjunctivitis   . Arthritis   . Cataract   . Degenerative arthritis 04/20/2013  . Hypertension   . NAFLD (nonalcoholic fatty liver disease) 02/15/8118  . Seborrhea   . Thrombocytopenia (HCC)    Past Surgical History:  Procedure Laterality Date  . COLONOSCOPY    . HEMORRHOID BANDING    . HERNIA REPAIR     x 3   Allergies  Allergen Reactions  . Tramadol Nausea Only   Current Outpatient Prescriptions  Medication Sig Dispense Refill  . atenolol (TENORMIN) 50 MG tablet TAKE ONE (1) TABLET BY MOUTH EVERY DAY TOME UNA TABLETA POR LA BOCA CADA DIA 90 tablet  0  . atenolol (TENORMIN) 50 MG tablet Take 1 tablet (50 mg total) by mouth daily. 90 tablet 3  . desonide (DESOWEN) 0.05 % cream Apply topically 2 (two) times daily. 30 g 3  . enalapril (VASOTEC) 20 MG tablet Take 1 tablet (20 mg total) by mouth daily. 90 tablet 3  . Fish Oil-Cholecalciferol (OMEGA-3 FISH OIL/VITAMIN D3 PO) Take by mouth.    . Fluocinolone Acetonide 0.01 % OIL Place 1 application in  ear(s) daily as needed. 20 mL 0  . hydrocortisone (ANUCORT-HC) 25 MG suppository INSERT ONE SUPPOSITORY RECTALLY TWICE DAILY 24 suppository 1  . hydrocortisone cream 1 % Apply 1 application topically 2 (two) times daily. 30 g 0  . ketoconazole (NIZORAL) 2 % shampoo Apply 1 application topically 2 (two) times a week. 120 mL 11  . valACYclovir (VALTREX) 500 MG tablet TAKE ONE (1) TABLET BY MOUTH EVERY DAY TOME UNA TABLETA POR LA BOCA CADA DIA 90 tablet 3  . Wheat Dextrin (BENEFIBER DRINK MIX PO) Take by mouth.    . meloxicam (MOBIC) 15 MG tablet Take 1 tablet (15 mg total) by mouth daily. 60 tablet 0   No current facility-administered medications for this visit.    Social History   Social History  . Marital status: Married    Spouse name: N/A  . Number of children: N/A  . Years of education: N/A   Occupational History  .                                                                                                                                                                                                                                                                 Engineered Controls   Social History Main Topics  . Smoking status: Never Smoker  . Smokeless tobacco: Never Used  . Alcohol use 0.0 oz/week     Comment: rarely drinks due to Red blood cell being low  . Drug use: No  . Sexual activity: Yes    Birth control/ protection: Post-menopausal   Other Topics Concern  . Not on file   Social History Narrative   Marital status: married x 18 years.  From [place] moved to [place] in 2008.      Children:  4 children; 1  stepchild.  Six grandchildren.      Lives:  With wife #2, dog/Sasha.      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      Tobacco: none      Alcohol: previous heavy use daily. None in  November 2016.       Drugs: none       Exercise:  Gym daily in 2018.       Seatbelt:  100% of time; no texting while driving.         Family History  Problem Relation Age of Onset  . Arthritis Mother   . Diabetes Mother   . Hypertension Mother   . Arthritis Father   . Prostate cancer Father   . Stroke Father        CVA x 5  . Hypertension Father   . Diabetes Father        with leg ampuation  . Cancer Father 2       prostate cancer  . Diabetes Brother   . HIV Brother   . Colon cancer Maternal Grandfather        Objective:    BP 118/78   Pulse 65   Temp 97.9 F (36.6 C) (Oral)   Resp 16   Ht 5' 6.93" (1.7 m)   Wt 147 lb (66.7 kg)   SpO2 96%   BMI 23.07 kg/m  Physical Exam  Constitutional: He is oriented to person, place, and time. He appears well-developed and well-nourished. No distress.  HENT:  Head: Normocephalic and atraumatic.  Right Ear: External ear normal.  Left Ear: External ear normal.  Nose: Nose normal.  Mouth/Throat: Oropharynx is clear and moist.  Eyes: Pupils are equal, round, and reactive to light. Conjunctivae and EOM are normal.  Neck: Normal range of motion. Neck supple. Carotid bruit is not present. No thyromegaly present.  Cardiovascular: Normal rate, regular rhythm, normal heart sounds and intact distal pulses.  Exam reveals no gallop and no friction rub.   No murmur heard. Pulmonary/Chest: Effort normal and breath sounds normal. He has no wheezes. He has no rales.  Abdominal: Soft. Bowel sounds are normal. He exhibits no distension and no mass. There is no tenderness. There is no rebound and no guarding.  Musculoskeletal:       Right shoulder: He exhibits pain. He exhibits normal range of motion, no tenderness, no bony tenderness, no spasm, normal pulse and normal strength.  R shoulder: pain with elevation greater than 90 degrees; cross over positive; empty can positive; strength 5/5 all directions.  Pain with resistance to internal and external rotation.  Lymphadenopathy:    He has no cervical adenopathy.  Neurological: He is alert and oriented to person, place, and  time. No cranial nerve deficit.  Skin: Skin is warm and dry. No rash noted. He is not diaphoretic.  Psychiatric: He has a normal mood and affect. His behavior is normal.  Nursing note and vitals reviewed.   No results found. Depression screen [facility] 2/9 07/27/2017 01/20/2017 12/25/2015 10/04/2015 07/25/2014  Decreased Interest 0 0 0 0 0  Down, Depressed, Hopeless 0 0 0 0 0  PHQ - 2 Score 0 0 0 0 0   Fall Risk  07/27/2017 01/20/2017 12/25/2015 10/04/2015 07/25/2014  Falls in the past year? No No No No No        Assessment & Plan:   1. Essential hypertension, benign   2. NAFLD (nonalcoholic fatty liver disease)  3. Seborrhea capitis   4. Thrombocytopenia (HCC)   5. Pain in joint of right shoulder   6. Need for prophylactic vaccination and inoculation against influenza   7. Dermatitis due to sun   8. Marital conflict    -hypertension well controlled; obtain labs; continue current medications. -new onset R shoulder pain; consistent with rotator cuff strain; obtain xray; home exercise program provided; if no improvement in six weeks, call office for ortho referral.  Mobic. -sun related dermatitis; new onset; avoid prolonged sun exposure. -marital strain; highly recommend psychotherapy; name of therapists provided again.  Orders Placed This Encounter  Procedures  . DG Shoulder Right    Standing Status:   Future    Number of Occurrences:   1    Standing Expiration Date:   07/27/2018    Order Specific Question:   Reason for Exam (SYMPTOM  OR DIAGNOSIS REQUIRED)    Answer:   R shoulder pain with elevation above 90 degrees; no injury; overuse.    Order Specific Question:   Preferred imaging location?    Answer:   External  . Flu Vaccine QUAD 36+ mos IM  . CBC with Differential/Platelet  . Comprehensive metabolic panel   Meds ordered this encounter  Medications  . meloxicam (MOBIC) 15 MG tablet    Sig: Take 1 tablet (15 mg total) by mouth daily.    Dispense:  60 tablet    Refill:  0     Return in about 6 months (around 01/24/2018) for complete physical examiniation.   Jovanne Riggenbach Paulita Fujita, M.D. Primary Care at [facility] previously [facility] [address] [place], [place]  [address] [phone] phone [phone] fax

## 2017-07-27 ENCOUNTER — Encounter: Payer: Self-pay | Admitting: Family Medicine

## 2017-07-27 ENCOUNTER — Ambulatory Visit (INDEPENDENT_AMBULATORY_CARE_PROVIDER_SITE_OTHER): Payer: Managed Care, Other (non HMO) | Admitting: Family Medicine

## 2017-07-27 ENCOUNTER — Ambulatory Visit (INDEPENDENT_AMBULATORY_CARE_PROVIDER_SITE_OTHER): Payer: Managed Care, Other (non HMO)

## 2017-07-27 VITALS — BP 118/78 | HR 65 | Temp 97.9°F | Resp 16 | Ht 66.93 in | Wt 147.0 lb

## 2017-07-27 DIAGNOSIS — I10 Essential (primary) hypertension: Secondary | ICD-10-CM

## 2017-07-27 DIAGNOSIS — M25511 Pain in right shoulder: Secondary | ICD-10-CM

## 2017-07-27 DIAGNOSIS — D696 Thrombocytopenia, unspecified: Secondary | ICD-10-CM

## 2017-07-27 DIAGNOSIS — L210 Seborrhea capitis: Secondary | ICD-10-CM

## 2017-07-27 DIAGNOSIS — K760 Fatty (change of) liver, not elsewhere classified: Secondary | ICD-10-CM

## 2017-07-27 DIAGNOSIS — L578 Other skin changes due to chronic exposure to nonionizing radiation: Secondary | ICD-10-CM

## 2017-07-27 DIAGNOSIS — Z630 Problems in relationship with spouse or partner: Secondary | ICD-10-CM

## 2017-07-27 DIAGNOSIS — Z23 Encounter for immunization: Secondary | ICD-10-CM | POA: Diagnosis not present

## 2017-07-27 MED ORDER — MELOXICAM 15 MG PO TABS: 15.0000 mg | ORAL_TABLET | Freq: Every day | ORAL | 0 refills | Status: DC

## 2017-07-27 NOTE — Patient Instructions (Addendum)
Call Dr. Katrinka Blazing if your shoulder is still painful in six weeks.  [phone].  Recommend the following therapist/counselor:   Charlie Pitter [phone]  Harle Battiest [phone]  Oscar La [phone]  Coral Spikes [phone]   IF you received an x-ray today, you will receive an invoice from [facility]. Please contact [facility] at [phone] with questions or concerns regarding your invoice.   IF you received labwork today, you will receive an invoice from [place]. Please contact [facility] at [phone] with questions or concerns regarding your invoice.   Our billing staff will not be able to assist you with questions regarding bills from these companies.  You will be contacted with the lab results as soon as they are available. The fastest way to get your results is to activate your My Chart account. Instructions are located on the last page of this paperwork. If you have not heard from [place] regarding the results in 2 weeks, please contact this office.      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 Haga los ejercicios exactamente como se lo haya indicado el mdico y gradelos como se lo hayan indicado. Es normal sentir tirantez, tensin, presin o molestias leves mientras hace estos ejercicios, pero debe detenerse de inmediato si siente dolor repentino o si el dolor empeora.No comience a hacer estos ejercicios hasta que se lo indique el mdico. EJERCICIO DE [place] Y AMPLITUD DE MOVIMIENTOS Este ejercicio calienta los msculos y las articulaciones, y mejora la movilidad y la flexibilidad del hombro. Adems, ayuda a Engineer, materials y la rigidez. EjercicioA: Aduccin horizontal, pasivo 1. Sintese o prese, y lleve el brazo izquierdo/derecho con el codo extendido [employer] hombro opuesto, cruzando el pecho. Detngase cuando  sienta una elongacin suave atrs del hombro y la parte superior del brazo.  Mantenga el brazo a la altura del hombro.  Mantenga el brazo lo ms cerca que pueda de su cuerpo, sin Cabin crew. 1. Mantenga esta posicin durante __________ segundos. 2. Vuelva lentamente a la posicin inicial. Repita __________ veces. Realice este ejercicio __________ veces al da. EJERCICIOS DE FORTALECIMIENTO Estos ejercicios fortalecen el hombro y le otorgan resistencia. La resistencia es la capacidad de usar los msculos durante un tiempo prolongado, incluso despus de que se cansen. EjercicioB: Rotacin externa, isometra 1. Prese o sintese en la entrada de [place], de frente al marco de la puerta. 2. Flexione el codo izquierdo/derecho y Systems analyst de atrs de la mueca contra el marco de la [place]. Solo la [employer] debe tocar el marco. Mantenga la parte superior del brazo al costado del cuerpo. 3. Presione suavemente la [employer] contra el marco de la [place], como si tratara de empujar el brazo en direccin contraria al abdomen.  Evite encoger el hombro mientras presiona con la mano sobre el marco de la [place]. Mantenga los omplatos juntos, llvelos hacia el centro de la espalda. 1. Mantenga esta posicin durante __________ segundos. 2. Afloje lentamente la tensin y relaje los msculos por completo antes de repetir el ejercicio. Repita __________ veces. Realice este ejercicio __________ veces al da. EjercicioC: Rotacin interna, isometra 1. Prese o sintese en la entrada de [place], de frente al marco de la puerta. 2. Flexione el codo izquierdo/derecho y Systems analyst interna de la mueca contra el marco de la [place]. Solo la [employer] debe tocar el marco. Mantenga la parte superior del brazo  al costado del cuerpo. 3. Presione suavemente la [employer] contra el marco de la [place], como si tratara de empujar el brazo hacia el abdomen.  Evite encoger el hombro mientras presiona con la  mano sobre el marco de la [place]. Mantenga los omplatos juntos, llvelos hacia el centro de la espalda. 1. Mantenga esta posicin durante __________ segundos. 2. Afloje lentamente la tensin y relaje los msculos por completo antes de repetir el ejercicio. Repita __________ veces. Realice este ejercicio __________ veces al da. EjercicioD: Protraccin escapular, decbito supino 1. Acustese boca arriba sobre una superficie firme. Sostenga una pesa de __________ con la mano izquierda/derecha. 2. Eleve el brazo izquierdo/derecho en el aire, de modo que la mano est directamente por encima de la articulacin del hombro. 3. Empuje con la pesa en el aire de forma que el hombro se levante de la superficie sobre la que est recostado. No mueva la cabeza, el cuello ni la espalda. 4. Mantenga esta posicin durante __________ segundos. 5. Vuelva lentamente a la posicin inicial. Relaje totalmente los msculos antes de repetir el ejercicio. Repita __________ veces. Realice este ejercicio __________ veces al da. EjercicioE: Retraccin escapular 1. Sintese en una silla estable que no tenga apoyabrazos o pngase de pie. 2. Ate una banda para ejercicios a un objeto estable que est frente a usted, de modo que la banda est a la altura del hombro. 3. Sostenga un extremo de la banda para ejercicios en cada mano. Las palmas deben estar Phoebe Sharps. 4. Junte los omplatos y [place] los codos ligeramente hacia atrs. No encoja los hombros al hacerlo. 5. Mantenga esta posicin durante __________ segundos. 6. Vuelva lentamente a la posicin inicial. Repita __________ veces. Realice este ejercicio __________ veces al da. EjercicioF: Extensin del hombro 1. Sintese en una silla estable que no tenga apoyabrazos o pngase de pie. 2. Ate una banda para ejercicios a un objeto estable que est frente a usted, de modo que la banda est por encima de la altura del hombro. 3. Sostenga un extremo de la banda para  ejercicios en cada mano. 4. Extienda los codos y [employer] manos a la altura de los hombros. 5. Junte los omplatos y baje las manos hacia los costados de los muslos. Detngase cuando las manos estn en la misma posicin en ambos costados. No deje que las manos vayan hacia atrs del cuerpo. 6. Mantenga esta posicin durante __________ segundos. 7. Vuelva lentamente a la posicin inicial. Repita __________ veces. Realice este ejercicio __________ veces al da. Esta informacin no tiene Theme park manager el consejo del mdico. Asegrese de hacerle al mdico cualquier pregunta que tenga. Document Released: 08/12/2006 Document Revised: 07/17/2015 Document Reviewed: 09/28/2015 Elsevier Interactive Patient Education  [employer].

## 2017-07-28 LAB — COMPREHENSIVE METABOLIC PANEL
A/G RATIO: 2 (ref 1.2–2.2)
ALT: 35 IU/L (ref 0–44)
AST: 24 IU/L (ref 0–40)
Albumin: 4.7 g/dL (ref 3.5–5.5)
Alkaline Phosphatase: 63 IU/L (ref 39–117)
BILIRUBIN TOTAL: 0.9 mg/dL (ref 0.0–1.2)
BUN/Creatinine Ratio: 29 — ABNORMAL HIGH (ref 9–20)
BUN: 24 mg/dL (ref 6–24)
CHLORIDE: 101 mmol/L (ref 96–106)
CO2: 25 mmol/L (ref 20–29)
Calcium: 9.5 mg/dL (ref 8.7–10.2)
Creatinine, Ser: 0.83 mg/dL (ref 0.76–1.27)
GFR calc Af Amer: 117 mL/min/{1.73_m2} (ref >59)
GFR calc non Af Amer: 101 mL/min/{1.73_m2} (ref >59)
GLUCOSE: 92 mg/dL (ref 65–99)
Globulin, Total: 2.3 g/dL (ref 1.5–4.5)
POTASSIUM: 4.2 mmol/L (ref 3.5–5.2)
Sodium: 141 mmol/L (ref 134–144)
Total Protein: 7 g/dL (ref 6.0–8.5)

## 2017-07-28 LAB — CBC WITH DIFFERENTIAL/PLATELET
BASOS ABS: 0 10*3/uL (ref 0.0–0.2)
Basos: 0 %
EOS (ABSOLUTE): 0.1 10*3/uL (ref 0.0–0.4)
Eos: 1 %
Hematocrit: 48.2 % (ref 37.5–51.0)
Hemoglobin: 16.4 g/dL (ref 13.0–17.7)
IMMATURE GRANS (ABS): 0 10*3/uL (ref 0.0–0.1)
IMMATURE GRANULOCYTES: 0 %
LYMPHS: 23 %
Lymphocytes Absolute: 1.6 10*3/uL (ref 0.7–3.1)
MCH: 31.8 pg (ref 26.6–33.0)
MCHC: 34 g/dL (ref 31.5–35.7)
MCV: 94 fL (ref 79–97)
MONOS ABS: 0.6 10*3/uL (ref 0.1–0.9)
Monocytes: 8 %
NEUTROS ABS: 4.8 10*3/uL (ref 1.4–7.0)
NEUTROS PCT: 68 %
PLATELETS: 123 10*3/uL — AB (ref 150–379)
RBC: 5.15 x10E6/uL (ref 4.14–5.80)
RDW: 13.3 % (ref 12.3–15.4)
WBC: 7.2 10*3/uL (ref 3.4–10.8)

## 2017-07-29 DIAGNOSIS — Z630 Problems in relationship with spouse or partner: Secondary | ICD-10-CM | POA: Insufficient documentation

## 2017-07-29 DIAGNOSIS — L578 Other skin changes due to chronic exposure to nonionizing radiation: Secondary | ICD-10-CM | POA: Insufficient documentation

## 2017-08-03 ENCOUNTER — Other Ambulatory Visit: Payer: Self-pay

## 2017-08-03 DIAGNOSIS — I10 Essential (primary) hypertension: Secondary | ICD-10-CM

## 2017-08-03 MED ORDER — ATENOLOL 50 MG PO TABS: 50.0000 mg | ORAL_TABLET | Freq: Every day | ORAL | 3 refills | Status: DC

## 2017-08-03 MED ORDER — ATENOLOL 50 MG PO TABS: ORAL_TABLET | ORAL | 0 refills | Status: DC

## 2017-08-03 MED ORDER — ENALAPRIL MALEATE 20 MG PO TABS: 20.0000 mg | ORAL_TABLET | Freq: Every day | ORAL | 3 refills | Status: DC

## 2017-10-04 ENCOUNTER — Other Ambulatory Visit: Payer: Self-pay | Admitting: Family Medicine

## 2017-10-04 DIAGNOSIS — L210 Seborrhea capitis: Secondary | ICD-10-CM

## 2017-10-08 ENCOUNTER — Ambulatory Visit: Payer: Managed Care, Other (non HMO) | Admitting: Family Medicine

## 2017-10-08 ENCOUNTER — Other Ambulatory Visit: Payer: Self-pay | Admitting: Family Medicine

## 2017-10-08 ENCOUNTER — Other Ambulatory Visit: Payer: Self-pay

## 2017-10-08 ENCOUNTER — Encounter: Payer: Self-pay | Admitting: Family Medicine

## 2017-10-08 VITALS — BP 122/68 | HR 62 | Temp 98.3°F | Resp 16 | Ht 66.93 in | Wt 146.0 lb

## 2017-10-08 DIAGNOSIS — L210 Seborrhea capitis: Secondary | ICD-10-CM

## 2017-10-08 DIAGNOSIS — H00014 Hordeolum externum left upper eyelid: Secondary | ICD-10-CM | POA: Diagnosis not present

## 2017-10-08 DIAGNOSIS — L219 Seborrheic dermatitis, unspecified: Secondary | ICD-10-CM

## 2017-10-08 DIAGNOSIS — H1013 Acute atopic conjunctivitis, bilateral: Secondary | ICD-10-CM

## 2017-10-08 DIAGNOSIS — N529 Male erectile dysfunction, unspecified: Secondary | ICD-10-CM

## 2017-10-08 MED ORDER — SILDENAFIL CITRATE 20 MG PO TABS: 20.0000 mg | ORAL_TABLET | Freq: Every day | ORAL | 2 refills | Status: DC | PRN

## 2017-10-08 MED ORDER — AMOXICILLIN-POT CLAVULANATE 875-125 MG PO TABS: 1.0000 | ORAL_TABLET | Freq: Two times a day (BID) | ORAL | 0 refills | Status: DC

## 2017-10-08 MED ORDER — OLOPATADINE HCL 0.2 % OP SOLN: 1.0000 [drp] | Freq: Every day | OPHTHALMIC | 11 refills | Status: DC

## 2017-10-08 NOTE — Patient Instructions (Addendum)
Warm compress twice daily for fifteen minutes each time.   Take antibiotic twice daily for ten days. Use eye drop twice daily for itching.   IF you received an x-ray today, you will receive an invoice from [facility]. Please contact [facility] at [phone] with questions or concerns regarding your invoice.   IF you received labwork today, you will receive an [facility] from [facility]. Please contact [facility] at [phone] with questions or concerns regarding your invoice.   Our billing staff will not be able to assist you with questions regarding bills from these companies.  You will be contacted with the lab results as soon as they are available. The fastest way to get your results is to activate your My Chart account. Instructions are located on the last page of this paperwork. If you have not heard from us regarding the results in 2 weeks, please contact this office.     Orzuelo (Stye) Un orzuelo es un bulto en el prpado causado por una infeccin bacteriana. Puede formarse dentro del prpado (orzuelo interno) o fuera del prpado (orzuelo externo). Un orzuelo interno puede ser causado por una infeccin en una glndula sebcea dentro del prpado. Un orzuelo externo puede estar causado por una infeccin en la base de la pestaa (folculo piloso). Los orzuelos son muy frecuentes. Todas las personas pueden tener orzuelos a Actuarycualquier edad. Suelen ocurrir solo en un ojo, Biomedical engineerpero puede tener ms de Inteluno en los dos ojos. CAUSAS La infeccin casi siempre es causada por una bacteria llamada Staphylococcus aureus, que es un tipo comn de bacteria que vive en la piel. FACTORES DE RIESGO Puede tener un riesgo ms alto de sufrir un orzuelo si ya ha tenido Donaldsuno. Tambin puede tener un riesgo ms alto si tiene:  Diabetes.  Una enfermedad crnica.  Enrojecimiento prolongado en los ojos.  Una afeccin cutnea denominada seborrea.  Niveles altos de grasa en la sangre  (lpidos). SIGNOS Y SNTOMAS El dolor en el prpado es el sntoma ms frecuente del [place]. Los orzuelos internos son ms dolorosos que los externos. Otros signos y sntomas pueden incluir los siguientes:  Hinchazn dolorosa del prpado.  Sensacin de [employer] Grouppicazn en el ojo.  Lagrimeo y enrojecimiento del ojo.  Pus que drena del orzuelo. DIAGNSTICO Con tan solo examinarle el ojo, el mdico puede diagnosticarle un [place]zuelo. Tambin puede revisarlo para asegurarse de que:  No tenga fiebre ni otros signos de una infeccin ms grave.  La infeccin no se haya diseminado a otras partes del ojo o a zonas circundantes. TRATAMIENTO La mayora de los orzuelos desparecen en unos das sin [place]tratamiento. En algunos casos, puede necesitar antibiticos en gotas o ungento para prevenir la infeccin. Es posible que el mdico deba drenar el orzuelo por va quirrgica si este:  Es grande.  Causa mucho dolor.  Interfiere con la visin. Esto se puede realizar con un instrumento cortante de hoja delgada o una aguja. INSTRUCCIONES PARA EL CUIDADO EN EL HOGAR  Tome los medicamentos solamente como se lo haya indicado el mdico.  Aplique una compresa limpia y caliente sobre ojo durante 10minutos, 4veces al Futures traderda.  No use lentes de contacto ni maquillaje para los ojos [place] Mill[place] que el orzuelo se haya curado.  No trate de reventar o drenar el orzuelo. SOLICITE ATENCIN MDICA SI:  Tiene escalofros o fiebre.  El orzuelo no desaparece despus de [address] Roadvarios das.  El orzuelo afecta la visin.  Comienza a Psychiatristsentir dolor en el globo ocular, o se le hincha o enrojece.  ASEGRESE DE QUE:  Comprende estas instrucciones.  Controlar su afeccin.  Recibir ayuda de inmediato si no mejora o si empeora. Esta informacin no tiene Theme park managercomo fin reemplazar el consejo del mdico. Asegrese de hacerle al mdico cualquier pregunta que tenga. Document Released: 08/05/2005 Document Revised: 11/16/2014 Document Reviewed:  02/09/2014 Elsevier Interactive Patient Education  [employer]2018 Elsevier Inc.

## 2017-10-08 NOTE — Progress Notes (Signed)
Subjective:    Patient ID: Julian Bates, male    DOB: 1965/12/20, 51 y.o.   MRN: [id number]  10/08/2017  Mass (pt wife states he has had this bump on his eye x 1 month and it is getting worst. Left eye and it is itching. ) and Medication Refill    HPI This 51 y.o. male presents with wife for evaluation of L eye stye x 2 onset three weeks ago.    Recurrent issue.  Itching. Usually gets a stye every three months; frustrated with recurrence. Wants surgical resection.   No tearing.  +vision changes; no photophobia. No foreign body sensation. No drainage.   Conjunctiva red and swollen.  Applied neosporin on top with some relief. Applying warm compresses once daily at bedtime.   Viagra too expensive; insurance would not cover; causing marital strain.    Visual Acuity Screening   Right eye Left eye Both eyes  Without correction: 20/40 20/50 20/25   With correction:       BP Readings from Last 3 Encounters:  10/08/17 122/68  07/27/17 118/78  01/20/17 134/78   Wt Readings from Last 3 Encounters:  10/08/17 146 lb (66.2 kg)  07/27/17 147 lb (66.7 kg)  01/20/17 151 lb 6.4 oz (68.7 kg)   Immunization History  Administered Date(s) Administered  . Hepatitis A, Adult 10/04/2015, 04/02/2016  . Hepatitis B, adult 10/04/2015, 11/05/2015, 04/02/2016  . Influenza,inj,Quad PF,6+ Mos 01/06/2014, 07/25/2014, 10/04/2015, 01/20/2017, 07/27/2017  . Tdap 01/20/2017   No exam data present   Review of Systems  Constitutional: Negative for activity change, appetite change, chills, diaphoresis, fatigue and fever.  HENT: Negative for congestion, ear pain, postnasal drip, rhinorrhea, sinus pressure, sinus pain, sneezing and sore throat.   Eyes: Positive for pain, redness, itching and visual disturbance. Negative for photophobia and discharge.  Respiratory: Negative for cough and shortness of breath.   Gastrointestinal: Negative for diarrhea, nausea and vomiting.  Endocrine: Negative for  cold intolerance, heat intolerance, polydipsia, polyphagia and polyuria.  Skin: Negative for color change, rash and wound.  Psychiatric/Behavioral: Negative for dysphoric mood and sleep disturbance. The patient is not nervous/anxious.     Past Medical History:  Diagnosis Date  . Allergic conjunctivitis   . Arthritis   . Cataract   . Degenerative arthritis 04/20/2013  . Hypertension   . NAFLD (nonalcoholic fatty liver disease) 4/09/81197/28/2016  . Seborrhea   . Thrombocytopenia (HCC)    Past Surgical History:  Procedure Laterality Date  . COLONOSCOPY    . HEMORRHOID BANDING    . HERNIA REPAIR     x 3   Allergies  Allergen Reactions  . Tramadol Nausea Only   Current Outpatient Medications on File Prior to Visit  Medication Sig Dispense Refill  . atenolol (TENORMIN) 50 MG tablet TAKE ONE (1) TABLET BY MOUTH EVERY DAY TOME UNA TABLETA POR LA BOCA CADA DIA 90 tablet 0  . atenolol (TENORMIN) 50 MG tablet Take 1 tablet (50 mg total) by mouth daily. 90 tablet 3  . desonide (DESOWEN) 0.05 % cream APPLY TO AFFECTED AREA TWICE A DAY 30 g 3  . enalapril (VASOTEC) 20 MG tablet Take 1 tablet (20 mg total) by mouth daily. 90 tablet 3  . Fish Oil-Cholecalciferol (OMEGA-3 FISH OIL/VITAMIN D3 PO) Take by mouth.    . Fluocinolone Acetonide 0.01 % OIL Place 1 application in ear(s) daily as needed. 20 mL 0  . hydrocortisone (ANUCORT-HC) 25 MG suppository INSERT ONE SUPPOSITORY RECTALLY TWICE DAILY 24 suppository 1  .  ketoconazole (NIZORAL) 2 % shampoo Apply 1 application topically 2 (two) times a week. 120 mL 11  . meloxicam (MOBIC) 15 MG tablet Take 1 tablet (15 mg total) by mouth daily. 60 tablet 0  . valACYclovir (VALTREX) 500 MG tablet TAKE ONE (1) TABLET BY MOUTH EVERY DAY TOME UNA TABLETA POR LA BOCA CADA DIA 90 tablet 3  . Wheat Dextrin (BENEFIBER DRINK MIX PO) Take by mouth.     No current facility-administered medications on file prior to visit.    Social History   Socioeconomic History  .  Marital status: Married    Spouse name: Not on file  . Number of children: Not on file  . Years of education: Not on file  . Highest education level: Not on file  Social Needs  . Financial resource strain: Not on file  . Food insecurity - worry: Not on file  . Food insecurity - inability: Not on file  . Transportation needs - medical: Not on file  . Transportation needs - non-medical: Not on file  Occupational History  . Occupation:                                                                                                                                                                                                                                                                    Employer: [employer]  Tobacco Use  . Smoking status: Never Smoker  . Smokeless tobacco: Never Used  Substance and Sexual Activity  . Alcohol use: Yes    Alcohol/week: 0.0 oz    Comment: rarely drinks due to Red blood cell being low  . Drug use: No  . Sexual activity: Yes    Birth control/protection: Post-menopausal  Other Topics Concern  . Not on file  Social History Narrative   Marital status: married x 18 years.  From [place] moved to [place] in 2008.      Children:  4 children; 1 stepchild.  Six grandchildren.      Lives:  With wife #2, dog/Sasha.      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  Moderately happy.      Tobacco: none      Alcohol: previous heavy use daily. None in  November 2016.       Drugs: none       Exercise:  Gym daily in 2018.       Seatbelt:  100% of time; no texting while driving.        Family History  Problem Relation Age of Onset  . Arthritis Mother   . Diabetes Mother   . Hypertension Mother   . Arthritis Father   . Prostate cancer Father   . Stroke Father        CVA x 5  . Hypertension Father   . Diabetes Father        with leg ampuation  . Cancer Father [phone]       prostate cancer  . Diabetes Brother   . HIV Brother   .  Colon cancer Maternal Grandfather        Objective:    BP 122/68   Pulse 62   Temp 98.3 F (36.8 C) (Oral)   Resp 16   Ht 5' 6.93" (1.7 m)   Wt 146 lb (66.2 kg)   SpO2 97%   BMI 22.92 kg/m  Physical Exam  Constitutional: He is oriented to person, place, and time. He appears well-developed and well-nourished. No distress.  HENT:  Head: Normocephalic and atraumatic.  Right Ear: External ear normal.  Left Ear: External ear normal.  Nose: Nose normal.  Mouth/Throat: Oropharynx is clear and moist.  Eyes: EOM are normal. Pupils are equal, round, and reactive to light. Right conjunctiva is not injected. Right conjunctiva has no hemorrhage. Left conjunctiva is injected. Left conjunctiva has no hemorrhage. Right eye exhibits normal extraocular motion and no nystagmus. Left eye exhibits normal extraocular motion and no nystagmus. Right pupil is round and reactive. Left pupil is round and reactive. Pupils are equal.  Slit lamp exam:      The right eye shows no foreign body.       The left eye shows no foreign body.    +two pustules upper lid adjacent with surrounding erythema.  Neck: Normal range of motion. Neck supple. Carotid bruit is not present. No thyromegaly present.  Pulmonary/Chest: Effort normal. No respiratory distress.  Lymphadenopathy:    He has no cervical adenopathy.  Neurological: He is alert and oriented to person, place, and time. No cranial nerve deficit.  Skin: Skin is warm and dry. No rash noted. He is not diaphoretic.  Psychiatric: He has a normal mood and affect. His behavior is normal.  Nursing note and vitals reviewed.  No results found. Depression screen [facility] 2/9 10/08/2017 07/27/2017 01/20/2017 12/25/2015 10/04/2015  Decreased Interest 0 0 0 0 0  Down, Depressed, Hopeless 0 0 0 0 0  PHQ - 2 Score 0 0 0 0 0   Fall Risk  10/08/2017 07/27/2017 01/20/2017 12/25/2015 10/04/2015  Falls in the past year? No No No No No        Assessment & Plan:   1. Hordeolum  externum of left upper eyelid   2. Allergic conjunctivitis of both eyes   3. Impotence     New onset hordeolum x2 in the left upper lid.  Duration of 3 weeks with persistent erythema and swelling.  Secondary cellulitis is present today.  Prescription for Augmentin twice daily provided.  Also provided with Pataday allergy drops to treat underlying allergic conjunctivitis.  Allergic conjunctivitis uncontrolled and predisposing patient to recurrent hordeolum.  Prescription for Pataday allergy eyedrops provided to use twice daily year round.  Viagra 100 mg to expensive  and cost prohibitive.  Provided patient with sildenafil 20 mg to use 1-3 tablets within 30 minutes of sexual activity.  Discussed side effects with patient and wife at today's visit.  May need to pay cash and not use insurance for payment.  No orders of the defined types were placed in this encounter.  Meds ordered this encounter  Medications  . amoxicillin-clavulanate (AUGMENTIN) 875-125 MG tablet    Sig: Take 1 tablet by mouth 2 (two) times daily.    Dispense:  20 tablet    Refill:  0  . Olopatadine HCl 0.2 % SOLN    Sig: Apply 1 drop to eye daily.    Dispense:  2.5 mL    Refill:  11  . sildenafil (REVATIO) 20 MG tablet    Sig: Take 1-3 tablets (20-60 mg total) by mouth daily as needed.    Dispense:  30 tablet    Refill:  2    No Follow-up on file.   Marques Ericson Paulita Fujita, M.D. Primary Care at [facility] previously [facility] [address] [place], [place]  [address] [phone] phone [phone] fax

## 2017-10-27 IMAGING — DX DG SHOULDER 2+V*R*
3 series · 3 of 3 positions shown · non-contrast
Comparison: None.

CLINICAL DATA: Right shoulder pain.

EXAM:
RIGHT SHOULDER - 2+ VIEW

[shoulder ap]
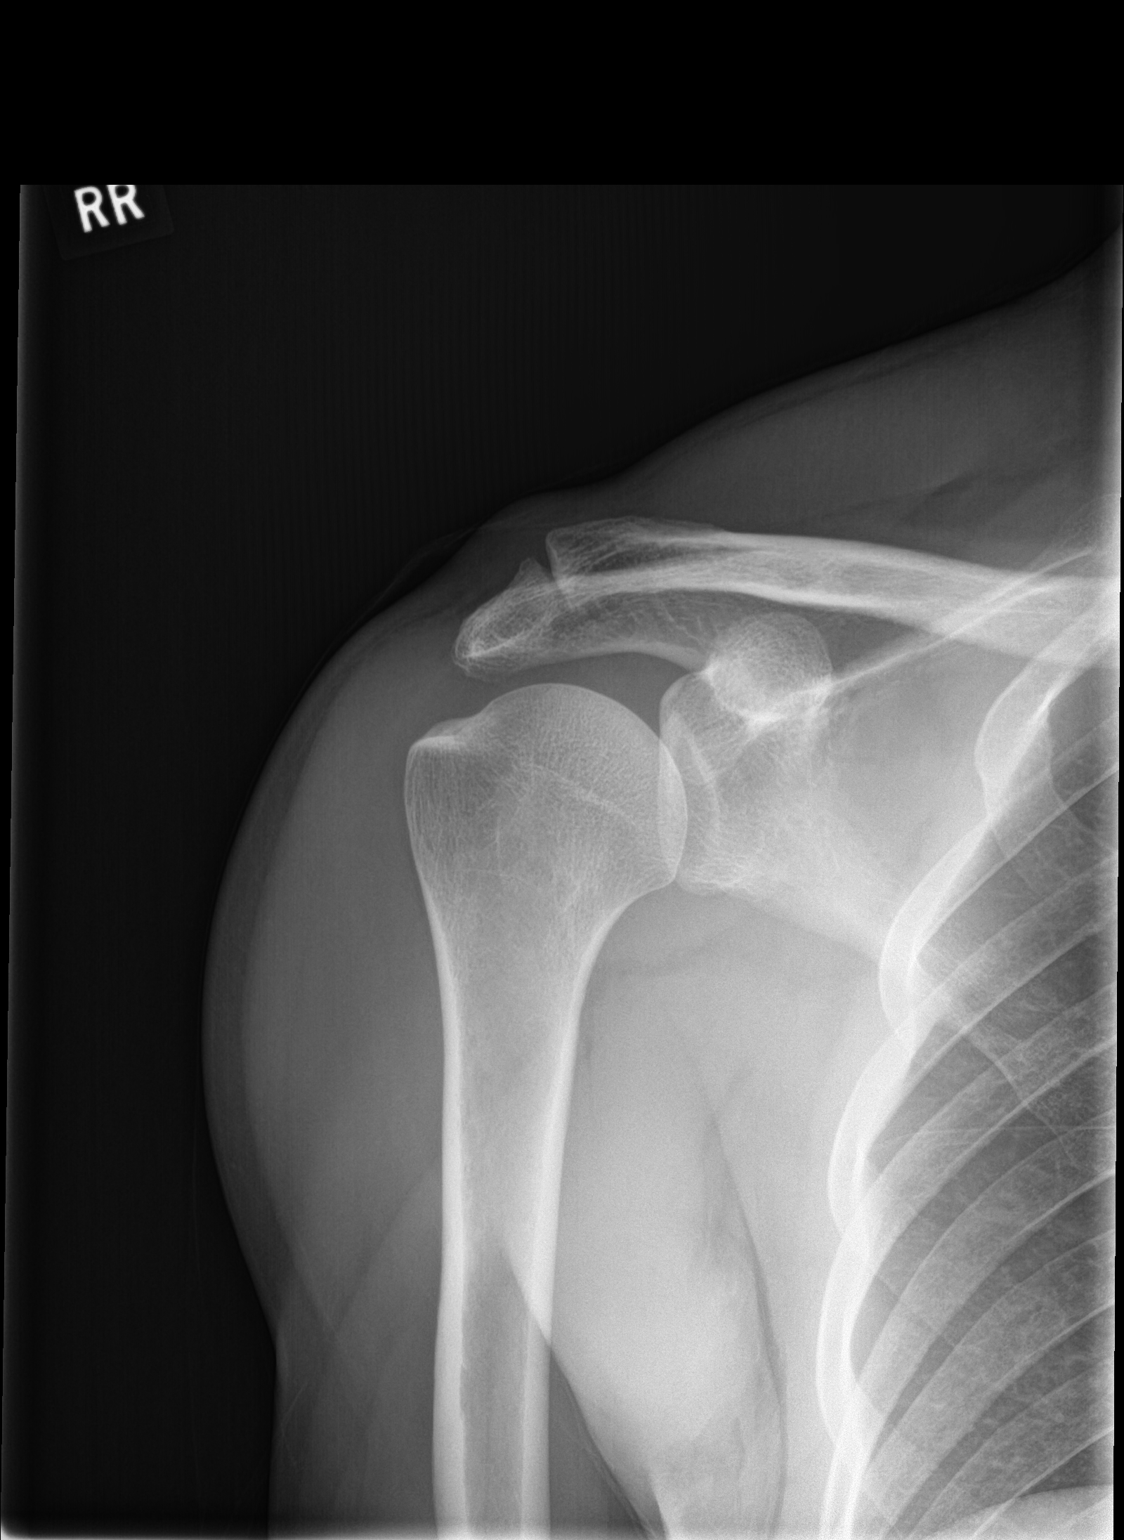

[shoulder y-view]
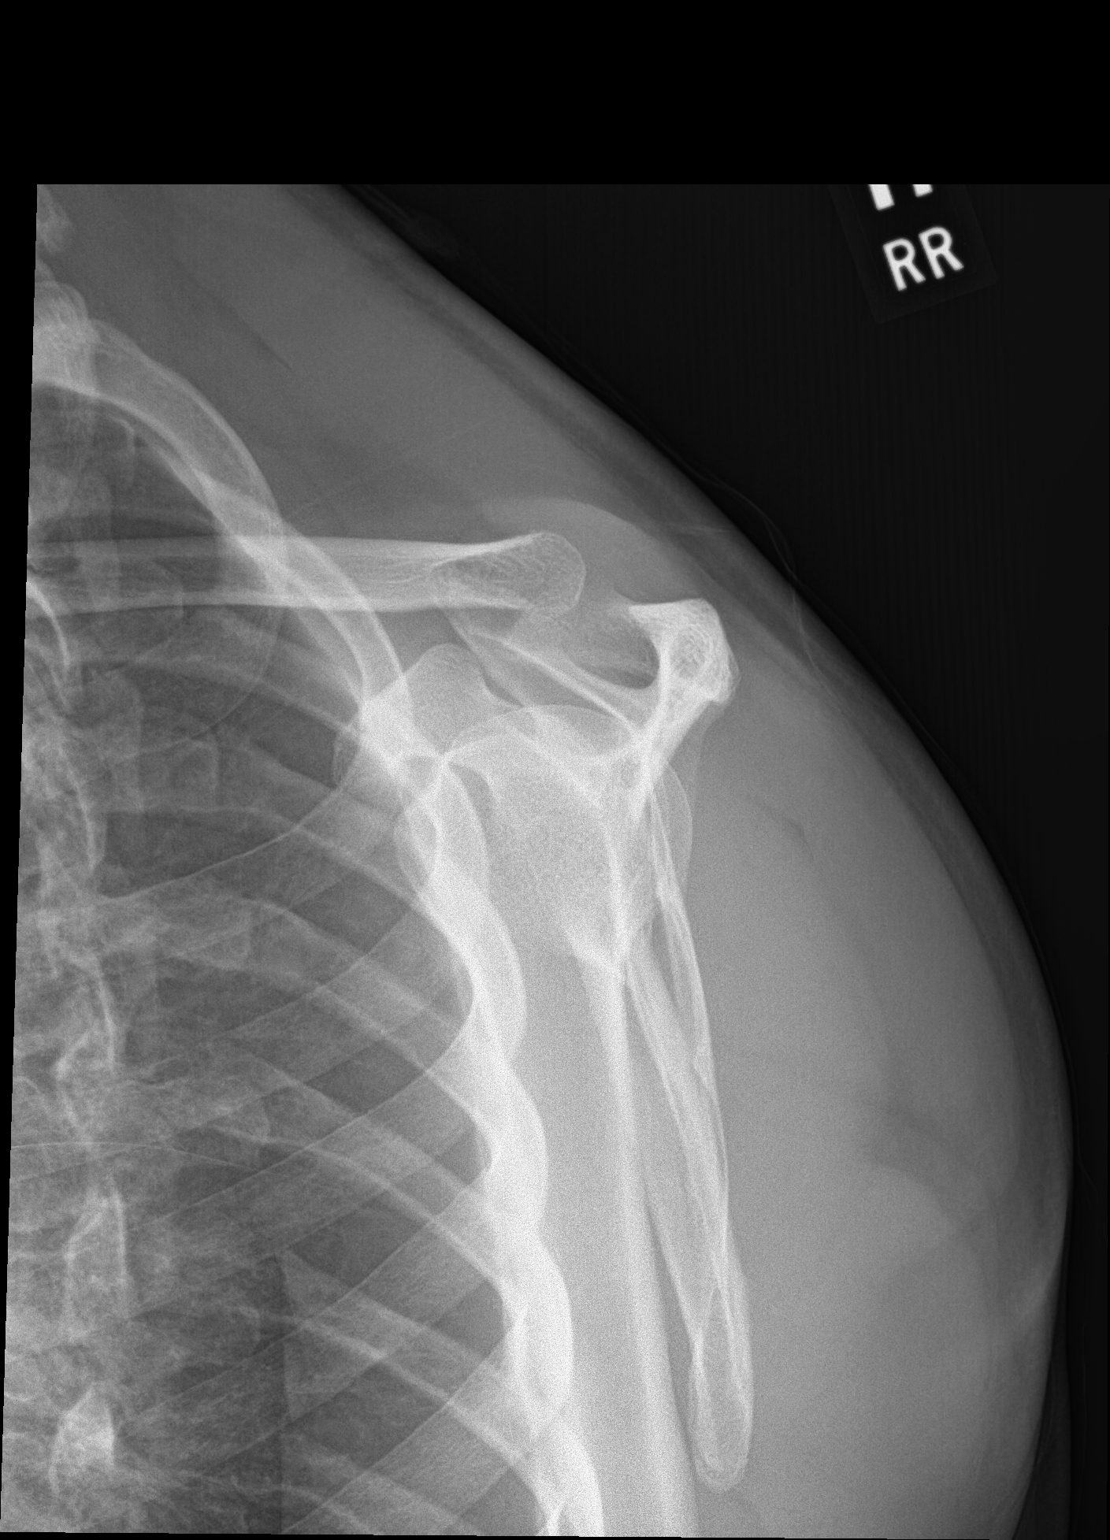

[shoulder axial]
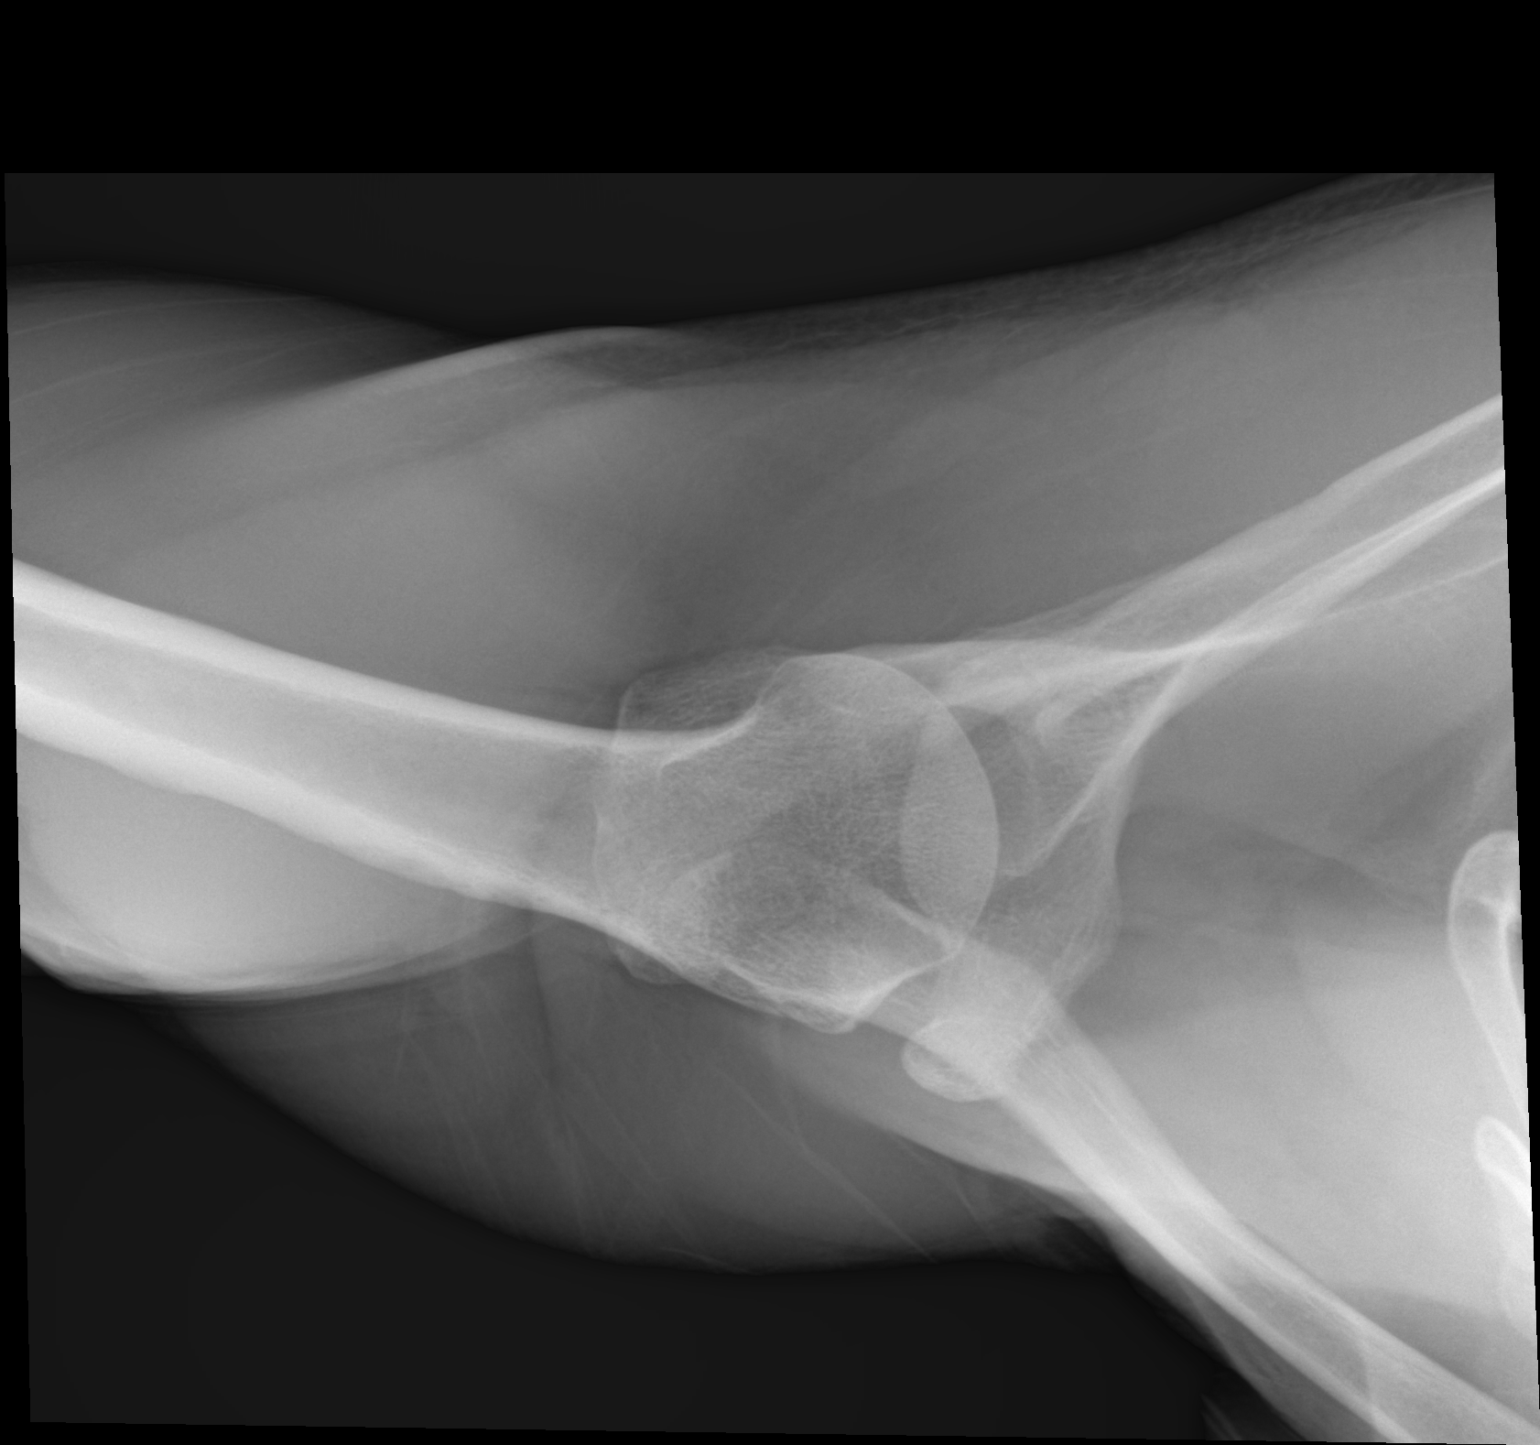

[3 of 3 positions shown; findings below may reference images not displayed]

FINDINGS: The joint spaces are maintained. No acute bony findings or abnormal
soft tissue calcifications. Slight lateral downsloping of the
acromion but no definite undersurface spurring. The visualize right
lung is clear and the visualized right ribs are intact.
IMPRESSION: No acute bony findings or significant degenerative changes.

## 2018-03-16 ENCOUNTER — Other Ambulatory Visit: Payer: Self-pay | Admitting: Family Medicine

## 2018-04-04 ENCOUNTER — Encounter: Payer: Self-pay | Admitting: Family Medicine

## 2018-04-13 ENCOUNTER — Other Ambulatory Visit: Payer: Self-pay | Admitting: Family Medicine

## 2018-04-24 ENCOUNTER — Other Ambulatory Visit: Payer: Self-pay | Admitting: Family Medicine

## 2018-05-24 ENCOUNTER — Other Ambulatory Visit: Payer: Self-pay | Admitting: Family Medicine

## 2018-05-24 NOTE — Telephone Encounter (Signed)
Valacyclovir refill Last Refill:04/13/18 # 30 Last OV: 01/20/17 PCP: 04/13/18 [facility]acy:[facility] [address]

## 2018-06-23 ENCOUNTER — Other Ambulatory Visit: Payer: Self-pay | Admitting: Family Medicine

## 2018-06-23 NOTE — Telephone Encounter (Signed)
valacyclocir refill Last Refill:05/24/18 # 30 Last OV: 01/20/17 PCP: Dr Nilda SimmerKristi Smith [facility] [place]

## 2018-07-05 ENCOUNTER — Encounter: Payer: Self-pay | Admitting: Family Medicine

## 2018-07-05 ENCOUNTER — Ambulatory Visit (INDEPENDENT_AMBULATORY_CARE_PROVIDER_SITE_OTHER): Payer: Managed Care, Other (non HMO) | Admitting: Family Medicine

## 2018-07-05 ENCOUNTER — Other Ambulatory Visit: Payer: Self-pay

## 2018-07-05 VITALS — BP 124/82 | HR 66 | Temp 98.3°F | Ht 66.5 in | Wt 148.2 lb

## 2018-07-05 DIAGNOSIS — Z0000 Encounter for general adult medical examination without abnormal findings: Secondary | ICD-10-CM

## 2018-07-05 DIAGNOSIS — Z8042 Family history of malignant neoplasm of prostate: Secondary | ICD-10-CM | POA: Diagnosis not present

## 2018-07-05 DIAGNOSIS — Z125 Encounter for screening for malignant neoplasm of prostate: Secondary | ICD-10-CM

## 2018-07-05 DIAGNOSIS — Z8619 Personal history of other infectious and parasitic diseases: Secondary | ICD-10-CM

## 2018-07-05 DIAGNOSIS — Z13220 Encounter for screening for lipoid disorders: Secondary | ICD-10-CM

## 2018-07-05 DIAGNOSIS — L210 Seborrhea capitis: Secondary | ICD-10-CM | POA: Diagnosis not present

## 2018-07-05 DIAGNOSIS — Z23 Encounter for immunization: Secondary | ICD-10-CM | POA: Diagnosis not present

## 2018-07-05 DIAGNOSIS — I10 Essential (primary) hypertension: Secondary | ICD-10-CM

## 2018-07-05 DIAGNOSIS — H9202 Otalgia, left ear: Secondary | ICD-10-CM

## 2018-07-05 DIAGNOSIS — K760 Fatty (change of) liver, not elsewhere classified: Secondary | ICD-10-CM

## 2018-07-05 MED ORDER — VALACYCLOVIR HCL 500 MG PO TABS: 500.0000 mg | ORAL_TABLET | Freq: Every day | ORAL | 3 refills | Status: DC

## 2018-07-05 MED ORDER — ENALAPRIL MALEATE 20 MG PO TABS: 20.0000 mg | ORAL_TABLET | Freq: Every day | ORAL | 3 refills | Status: AC

## 2018-07-05 MED ORDER — KETOCONAZOLE 2 % EX SHAM: 1.0000 "application " | MEDICATED_SHAMPOO | CUTANEOUS | 11 refills | Status: DC

## 2018-07-05 MED ORDER — ATENOLOL 50 MG PO TABS: ORAL_TABLET | ORAL | 3 refills | Status: DC

## 2018-07-05 NOTE — Progress Notes (Signed)
Subjective:  By signing my name below, I, Julian Bates, attest that this documentation has been prepared under the direction and in the presence of Shade FloodJeffrey R Sophiea Ueda, MD Electronically Signed: Charline BillsEssence Bates, ED Scribe 07/05/2018 at 8:43 AM.   Patient ID: Julian Bates, male    DOB: 13-May-1966, 52 y.o.   MRN: [id number]  Chief Complaint  Patient presents with  . Annual Exam    CPE   HPI Julian Bates is a 52 y.o. male who [facility] for an annual exam. H/o HTN, non-alcoholic fatty liver disease, ED. Pt is fasting at this visit.  HTN Enalapril 20 mg qd, atenolol 50 mg qd. - Does not check his BP at home. Denies new side-effects.  Lab Results  Component Value Date   CREATININE 0.83 07/27/2017   BP Readings from Last 3 Encounters:  07/05/18 124/82  10/08/17 122/68  07/27/17 118/78   Non-Alcoholic Fatty Liver Disease Lab Results  Component Value Date   ALT 35 07/27/2017   AST 24 07/27/2017   ALKPHOS 63 07/27/2017   BILITOT 0.9 07/27/2017   Wt Readings from Last 3 Encounters:  07/05/18 148 lb 3.2 oz (67.2 kg)  10/08/17 146 lb (66.2 kg)  07/27/17 147 lb (66.7 kg)   ED Treated in the past with Viagra.  H/o HSV Valtrex 500 mg qd. - Pt has been diagnosed with genital herpes but denies outbreak.  H/o Seborrheic Dermatitis   Reports dermatitis has been doing okay so he has not been using shampoo twice/wk only as needed.   Ear Pain Recurrent ear pain. See prev visit. Referred to ENT but was cost-prohibitive as would be out of pocket. - Pt states he has a scab in his ear that he occasionally picks at.  CA Screening Colonoscopy: 06/06/15 Dr. Leone PayorGessner, rpt 10 yrs Prostate CA Screening: 01/2017, normal. Father has a h/o prostate CA. Requests testing.  Lab Results  Component Value Date   PSA 0.24 12/25/2015   PSA 0.36 07/25/2014   Immunizations Immunization History  Administered Date(s) Administered  . Hepatitis A, Adult 10/04/2015,  04/02/2016  . Hepatitis B, adult 10/04/2015, 11/05/2015, 04/02/2016  . Influenza,inj,Quad PF,6+ Mos 01/06/2014, 07/25/2014, 10/04/2015, 01/20/2017, 07/27/2017  . 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 Marital conflict 07/29/2017  . Dermatitis due to sun 07/29/2017  . Chronic low back pain 07/08/2015  . NAFLD (nonalcoholic fatty liver disease) 40/98/119107/28/2016  . Thrombocytopenia (HCC) 05/09/2015  . Hemorrhoids, internal, with bleeding 05/09/2015  . Allergic conjunctivitis 07/27/2014  . Seborrhea 07/27/2014  . Seborrhea capitis 07/27/2014  . Chronic pain of left elbow 04/26/2013  . Chronic pain of left knee 04/26/2013  . Essential hypertension, benign 04/23/2013  . Degenerative arthritis 04/20/2013   Past Medical History:  Diagnosis Date  . Allergic conjunctivitis   . Arthritis   . Cataract   . Degenerative arthritis 04/20/2013  . Hypertension   . NAFLD (nonalcoholic fatty  liver disease) 06/06/2015  . Seborrhea   . Thrombocytopenia (HCC)    Past Surgical History:  Procedure Laterality Date  . COLONOSCOPY    . HEMORRHOID BANDING    . HERNIA REPAIR     x 3   Allergies  Allergen Reactions  . Tramadol Nausea Only   Prior to Admission medications   Medication Sig Start Date End Date Taking? Authorizing Provider  amoxicillin-clavulanate (AUGMENTIN) 875-125 MG tablet Take 1 tablet by mouth 2 (two) times daily. 10/08/17   [name], MD  atenolol (TENORMIN) 50 MG tablet TAKE ONE (1) TABLET BY MOUTH  EVERY DAY TOME UNA TABLETA POR LA BOCA CADA DIA 08/03/17   [name], MD  atenolol (TENORMIN) 50 MG tablet Take 1 tablet (50 mg total) by mouth daily. 08/03/17   [name], MD  desonide (DESOWEN) 0.05 % cream APPLY TO AFFECTED AREA TWICE A DAY 10/07/17   [name], MD  enalapril (VASOTEC) 20 MG tablet Take 1 tablet (20 mg total) by mouth daily. 08/03/17   [name], MD  Fish Oil-Cholecalciferol (OMEGA-3 FISH OIL/VITAMIN D3 PO) Take by mouth.    [provider]  Fluocinolone Acetonide 0.01 % OIL Place 1 application in ear(s) daily as needed. 01/20/17   [name], MD  hydrocortisone (ANUCORT-HC) 25 MG suppository INSERT ONE SUPPOSITORY RECTALLY TWICE DAILY 01/20/17   [name], MD  ketoconazole (NIZORAL) 2 % shampoo Apply 1 application topically 2 (two) times a week. 01/21/17   [name], MD  meloxicam (MOBIC) 15 MG tablet Take 1 tablet (15 mg total) by mouth daily. 07/27/17   [name], MD  Olopatadine HCl 0.2 % SOLN Apply 1 drop to eye daily. 10/08/17   [name], MD  sildenafil (REVATIO) 20 MG tablet Take 1-3 tablets (20-60 mg total) by mouth daily as needed. 10/08/17   [name], MD  valACYclovir (VALTREX) 500 MG tablet TOME UNA TABLETA CADA DIA 06/23/18   Doristine Bosworth, MD  Wheat Dextrin (BENEFIBER DRINK MIX PO) Take by mouth.    [provider]   Social History   Socioeconomic History  . Marital status: Married    Spouse name: Not on file  . Number of children: Not on file  . Years of education: Not on file  . Highest education level: Not on file  Occupational History  . Occupation:  Employer: engineered controls  Social Needs  . Financial resource strain: Not  on file  . Food insecurity:    Worry: Not on file    Inability: Not on file  . Transportation needs:    Medical: Not on file    Non-medical: Not on file  Tobacco Use  . Smoking status: Never Smoker  . Smokeless tobacco: Never Used  Substance and Sexual Activity  . Alcohol use: Yes    Alcohol/week: 0.0 standard drinks    Comment: rarely drinks due to Red blood cell being low  . Drug use: No  . Sexual activity: Yes    Birth control/protection: Post-menopausal  Lifestyle  . Physical activity:    Days per week: Not on file    Minutes per session: Not on file  . Stress: Not on file  Relationships  . Social connections:    Talks on phone: Not on file    Gets together: Not on file    Attends religious service: Not on file    Active member of club or organization: Not on file    Attends meetings of clubs or organizations: Not on file    Relationship status: Not on file  . Intimate partner violence:    Fear of current or ex partner: Not on file    Emotionally abused: Not on file    Physically abused: Not on file    Forced sexual activity: Not on file  Other Topics Concern  . Not on file  Social History Narrative   Marital status: married x 18 years.  From [place] moved to [place] in 2008.      Children:  4 children; 1 stepchild.  Six grandchildren.      Lives:  With wife #2, dog/Sasha.      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  Moderately happy.      Tobacco: none      Alcohol: previous heavy use daily. None in  November 2016.       Drugs: none       Exercise:  Gym daily in 2018.       Seatbelt:  100% of time; no texting while driving.        Review of Systems  Eyes: Positive for itching.  Skin: Positive for wound.  All other systems reviewed and are negative. 13 point ROS neg other than above    Objective:   Physical Exam  Constitutional: He is oriented to person, place, and time. He appears well-developed and well-nourished.  HENT:  Head:  Normocephalic and atraumatic.  Right Ear: External ear normal.  Left Ear: External ear normal.  Mouth/Throat: Oropharynx is clear and moist.  Cerumen in L canal. No rash, abrasions or concerning findings.  Eyes: Pupils are equal, round, and reactive to light. Conjunctivae and EOM are normal.  Neck: Normal range of motion. Neck supple. No thyromegaly present.  Cardiovascular: Normal rate, regular rhythm, normal heart sounds and intact distal pulses.  Pulmonary/Chest: Effort normal and breath sounds normal. No respiratory distress. He has no wheezes.  Abdominal: Soft. He exhibits no distension. There is no tenderness. Hernia confirmed negative in the right inguinal area and confirmed negative in the left inguinal area.  Genitourinary: Prostate normal.  Musculoskeletal: Normal range of motion. He exhibits no edema or tenderness.  Lymphadenopathy:    He has no cervical adenopathy.  Neurological: He is alert and oriented to person, place, and time. He has normal reflexes.  Skin: Skin is  warm and dry.  Psychiatric: He has a normal mood and affect. His behavior is normal.  Vitals reviewed.  Vitals:   07/05/18 0812  BP: 124/82  Pulse: 66  Temp: 98.3 F (36.8 C)  TempSrc: Oral  SpO2: 98%  Weight: 148 lb 3.2 oz (67.2 kg)  Height: 5' 6.5" (1.689 m)      Assessment & Plan:   Atari Novick is a 52 y.o. male Annual physical exam  --anticipatory guidance as below in AVS, screening labs above. Health maintenance items as above in HPI discussed/recommended as applicable.   Essential hypertension, benign - Plan: Comprehensive metabolic panel, atenolol (TENORMIN) 50 MG tablet, enalapril (VASOTEC) 20 MG tablet  - Stable, tolerating current regimen. Medications refilled. Labs pending as above.    Seborrhea capitis - Plan: ketoconazole (NIZORAL) 2 % shampoo  -  Stable, tolerating current regimen. Medications refilled for prn use. rtc precautions.   Needs flu shot - Plan: Flu Vaccine QUAD  36+ mos IM  Screening for prostate cancer - Plan: PSA Family history of prostate cancer - Plan: PSA  - We discussed pros and cons of prostate cancer screening, and after this discussion, he chose to have screening done. PSA obtained, and no concerning findings on DRE.   NAFLD (nonalcoholic fatty liver disease)  -Check CMP for stability of LFTs.  Screening for hyperlipidemia - Plan: Lipid panel  H/O cold sores - Plan: valACYclovir (VALTREX) 500 MG tablet  -Tolerating current dose of Valtrex, continue same.  Left ear pain - Plan: Ambulatory referral to ENT  -Recurrent left ear symptoms.  See prior visit.  Possible eczematous component versus seborrheic component but will have evaluation through ENT.  New referral was placed for provider in network.  Meds ordered this encounter  Medications  . atenolol (TENORMIN) 50 MG tablet    Sig: TAKE ONE (1) TABLET BY MOUTH EVERY DAY TOME UNA TABLETA POR LA BOCA CADA DIA    Dispense:  90 tablet    Refill:  3  . enalapril (VASOTEC) 20 MG tablet    Sig: Take 1 tablet (20 mg total) by mouth daily.    Dispense:  90 tablet    Refill:  3  . valACYclovir (VALTREX) 500 MG tablet    Sig: Take 1 tablet (500 mg total) by mouth daily.    Dispense:  90 tablet    Refill:  3  . ketoconazole (NIZORAL) 2 % shampoo    Sig: Apply 1 application topically 2 (two) times a week.    Dispense:  120 mL    Refill:  11   Patient Instructions   mismo medicina a este tiempo por presion.  voy a referir a specialista de ojos.  obtenecer cita de su doctor de ojos hablar simptomas de sus ojos.  por favor, regrese hablar area de su cara, y otros condiciones si necesario.  Gracias por su visita hoy.   Keeping you healthy  Get these tests  Blood pressure- Have your blood pressure checked once a year by your healthcare provider.  Normal blood pressure is 120/80  Weight- Have your body mass index (BMI) calculated to screen for obesity.  BMI is a measure of body fat  based on height and weight. You can also calculate your own BMI at [email or web address].  Cholesterol- Have your cholesterol checked every year.  Diabetes- Have your blood sugar checked regularly if you have high blood pressure, high cholesterol, have a family history of diabetes or if you are overweight.  Screening for  Colon Cancer- Colonoscopy starting at age 7.  Screening may begin sooner depending on your family history and other health conditions. Follow up colonoscopy as directed by your Gastroenterologist.  Screening for Prostate Cancer- Both blood work (PSA) and a rectal exam help screen for Prostate Cancer.  Screening begins at age 89 with African-American men and at age 19 with Caucasian men.  Screening may begin sooner depending on your family history.  Take these medicines  Aspirin- One aspirin daily can help prevent Heart disease and Stroke.  Flu shot- Every fall.  Tetanus- Every 10 years.  Zostavax- Once after the age of 72 to prevent Shingles.  Pneumonia shot- Once after the age of 25; if you are younger than 64, ask your healthcare provider if you need a Pneumonia shot.  Take these steps  Don't smoke- If you do smoke, talk to your doctor about quitting.  For tips on how to quit, go to www.smokefree.gov or call 1-800-QUIT-NOW.  Be physically active- Exercise 5 days a week for at least 30 minutes.  If you are not already physically active start slow and gradually work up to 30 minutes of moderate physical activity.  Examples of moderate activity include walking briskly, mowing the yard, dancing, swimming, bicycling, etc.  Eat a healthy diet- Eat a variety of healthy food such as fruits, vegetables, low fat milk, low fat cheese, yogurt, lean meant, poultry, fish, beans, tofu, etc. For more information go to www.thenutritionsource.org  Drink alcohol in moderation- Limit alcohol intake to less than two drinks a day. Never drink and drive.  Dentist- Brush and floss  twice daily; visit your dentist twice a year.  Depression- Your emotional health is as important as your physical health. If you're feeling down, or losing interest in things you would normally enjoy please talk to your healthcare provider.  Eye exam- Visit your eye doctor every year.  Safe sex- If you may be exposed to a sexually transmitted infection, use a condom.  Seat belts- Seat belts can save your life; always wear one.  Smoke/Carbon Monoxide detectors- These detectors need to be installed on the appropriate level of your home.  Replace batteries at least once a year.  Skin cancer- When out in the sun, cover up and use sunscreen 15 SPF or higher.  Violence- If anyone is threatening you, please tell your healthcare provider.  Living Will/ Health care power of attorney- Speak with your healthcare provider and family.        If you have lab work done today you will be contacted with your lab results within the next 2 weeks.  If you have not heard from [place] then please contact us. The fastest way to get your results is to register for My Chart.   IF you received an x-ray today, you will receive an invoice from [facility]. Please contact [facility] at [phone] with questions or concerns regarding your invoice.   IF you received labwork today, you will receive an invoice from [place]. Please contact [facility] at [phone] with questions or concerns regarding your invoice.   Our billing staff will not be able to assist you with questions regarding bills from these companies.  You will be contacted with the lab results as soon as they are available. The fastest way to get your results is to activate your My Chart account. Instructions are located on the last page of this paperwork. If you have not heard from [place] regarding the results in 2 weeks, please contact  this office.       I personally performed the services described in this documentation, which  was scribed in my presence. The recorded information has been reviewed and considered for accuracy and completeness, addended by me as needed, and agree with information above.  Signed,   Meredith Staggers, MD Primary Care at [facility].  07/06/18 10:10 PM

## 2018-07-05 NOTE — Patient Instructions (Addendum)
mismo medicina a este tiempo por presion.  voy a referir a specialista de ojos.  obtenecer cita de su doctor de ojos hablar simptomas de sus ojos.  por favor, regrese hablar area de su cara, y otros condiciones si necesario.  Gracias por su visita hoy.   Keeping you healthy  Get these tests  Blood pressure- Have your blood pressure checked once a year by your healthcare provider.  Normal blood pressure is 120/80  Weight- Have your body mass index (BMI) calculated to screen for obesity.  BMI is a measure of body fat based on height and weight. You can also calculate your own BMI at [email or web address]/.  Cholesterol- Have your cholesterol checked every year.  Diabetes- Have your blood sugar checked regularly if you have high blood pressure, high cholesterol, have a family history of diabetes or if you are overweight.  Screening for Colon Cancer- Colonoscopy starting at age 53.  Screening may begin sooner depending on your family history and other health conditions. Follow up colonoscopy as directed by your Gastroenterologist.  Screening for Prostate Cancer- Both blood work (PSA) and a rectal exam help screen for Prostate Cancer.  Screening begins at age 53 with African-American men and at age 53 with Caucasian men.  Screening may begin sooner depending on your family history.  Take these medicines  Aspirin- One aspirin daily can help prevent Heart disease and Stroke.  Flu shot- Every fall.  Tetanus- Every 10 years.  Zostavax- Once after the age of 53 to prevent Shingles.  Pneumonia shot- Once after the age of 53; if you are younger than 4365, ask your healthcare provider if you need a Pneumonia shot.  Take these steps  Don't smoke- If you do smoke, talk to your doctor about quitting.  For tips on how to quit, go to www.smokefree.gov or call 1-800-QUIT-NOW.  Be physically active- Exercise 5 days a week for at least 30 minutes.  If you are not already physically active start slow  and gradually work up to 30 minutes of moderate physical activity.  Examples of moderate activity include walking briskly, mowing the yard, dancing, swimming, bicycling, etc.  Eat a healthy diet- Eat a variety of healthy food such as fruits, vegetables, low fat milk, low fat cheese, yogurt, lean meant, poultry, fish, beans, tofu, etc. For more information go to www.thenutritionsource.org  Drink alcohol in moderation- Limit alcohol intake to less than two drinks a day. Never drink and drive.  Dentist- Brush and floss twice daily; visit your dentist twice a year.  Depression- Your emotional health is as important as your physical health. If you're feeling down, or losing interest in things you would normally enjoy please talk to your healthcare provider.  Eye exam- Visit your eye doctor every year.  Safe sex- If you may be exposed to a sexually transmitted infection, use a condom.  Seat belts- Seat belts can save your life; always wear one.  Smoke/Carbon Monoxide detectors- These detectors need to be installed on the appropriate level of your home.  Replace batteries at least once a year.  Skin cancer- When out in the sun, cover up and use sunscreen 15 SPF or higher.  Violence- If anyone is threatening you, please tell your healthcare provider.  Living Will/ Health care power of attorney- Speak with your healthcare provider and family.        If you have lab work done today you will be contacted with your lab results within the next 2 weeks.  If  you have not heard from [place] then please contact us. The fastest way to get your results is to register for My Chart.   IF you received an x-ray today, you will receive an invoice from [facility]. Please contact [facility] at [phone] with questions or concerns regarding your invoice.   IF you received labwork today, you will receive an invoice from [place]. Please contact [facility] at [phone] with questions or  concerns regarding your invoice.   Our billing staff will not be able to assist you with questions regarding bills from these companies.  You will be contacted with the lab results as soon as they are available. The fastest way to get your results is to activate your My Chart account. Instructions are located on the last page of this paperwork. If you have not heard from [place] regarding the results in 2 weeks, please contact this office.

## 2018-07-06 LAB — COMPREHENSIVE METABOLIC PANEL
A/G RATIO: 2.1 (ref 1.2–2.2)
ALK PHOS: 59 IU/L (ref 39–117)
ALT: 20 IU/L (ref 0–44)
AST: 14 IU/L (ref 0–40)
Albumin: 4.7 g/dL (ref 3.5–5.5)
BUN/Creatinine Ratio: 21 — ABNORMAL HIGH (ref 9–20)
BUN: 17 mg/dL (ref 6–24)
Bilirubin Total: 0.7 mg/dL (ref 0.0–1.2)
CO2: 23 mmol/L (ref 20–29)
Calcium: 9.2 mg/dL (ref 8.7–10.2)
Chloride: 103 mmol/L (ref 96–106)
Creatinine, Ser: 0.82 mg/dL (ref 0.76–1.27)
GFR calc Af Amer: 117 mL/min/{1.73_m2} (ref >59)
GFR calc non Af Amer: 101 mL/min/{1.73_m2} (ref >59)
GLOBULIN, TOTAL: 2.2 g/dL (ref 1.5–4.5)
Glucose: 92 mg/dL (ref 65–99)
POTASSIUM: 3.6 mmol/L (ref 3.5–5.2)
SODIUM: 141 mmol/L (ref 134–144)
Total Protein: 6.9 g/dL (ref 6.0–8.5)

## 2018-07-06 LAB — PSA: Prostate Specific Ag, Serum: 0.4 ng/mL (ref 0.0–4.0)

## 2018-07-06 LAB — LIPID PANEL
CHOLESTEROL TOTAL: 134 mg/dL (ref 100–199)
Chol/HDL Ratio: 4.2 ratio (ref 0.0–5.0)
HDL: 32 mg/dL — ABNORMAL LOW (ref >39)
LDL Calculated: 63 mg/dL (ref 0–99)
TRIGLYCERIDES: 197 mg/dL — AB (ref 0–149)
VLDL Cholesterol Cal: 39 mg/dL (ref 5–40)

## 2018-10-21 ENCOUNTER — Other Ambulatory Visit: Payer: Self-pay | Admitting: Family Medicine

## 2018-10-21 DIAGNOSIS — Z8619 Personal history of other infectious and parasitic diseases: Secondary | ICD-10-CM

## 2019-01-26 ENCOUNTER — Other Ambulatory Visit: Payer: Self-pay | Admitting: Family Medicine

## 2019-01-26 DIAGNOSIS — Z8619 Personal history of other infectious and parasitic diseases: Secondary | ICD-10-CM

## 2019-01-26 NOTE — Telephone Encounter (Signed)
Requested Prescriptions  Pending Prescriptions Disp Refills  . valACYclovir (VALTREX) 500 MG tablet [Pharmacy Med Name: VALACYCLOVIR HCL 500 MG TABLET] 90 tablet 0    Sig: TOME UNA TABLETA CADA DIA     Antimicrobials:  Antiviral Agents - Anti-Herpetic Passed - 01/26/2019  1:59 AM      Passed - Valid encounter within last 12 months    Recent Outpatient Visits          6 months ago Annual physical exam   Primary Care at [facility], Asencion Partridge, MD   1 year ago Hordeolum externum of left upper eyelid   Primary Care at [facility], Myrle Sheng, MD   1 year ago Essential hypertension, benign   Primary Care at [facility], Myrle Sheng, MD   2 years ago Routine physical examination   Primary Care at [facility], Myrle Sheng, MD   2 years ago Need for hepatitis B vaccination   Primary Care at [facility], Myrle Sheng, MD

## 2019-04-24 ENCOUNTER — Other Ambulatory Visit: Payer: Self-pay

## 2019-04-24 ENCOUNTER — Ambulatory Visit (INDEPENDENT_AMBULATORY_CARE_PROVIDER_SITE_OTHER): Payer: No Typology Code available for payment source | Admitting: Urology

## 2019-04-24 ENCOUNTER — Encounter: Payer: Self-pay | Admitting: Urology

## 2019-04-24 VITALS — BP 122/75 | HR 63 | Ht 66.0 in | Wt 151.5 lb

## 2019-04-24 DIAGNOSIS — N5203 Combined arterial insufficiency and corporo-venous occlusive erectile dysfunction: Secondary | ICD-10-CM | POA: Diagnosis not present

## 2019-04-24 MED ORDER — TADALAFIL 20 MG PO TABS: ORAL_TABLET | ORAL | 0 refills | Status: AC

## 2019-04-24 NOTE — Progress Notes (Signed)
04/24/2019 9:38 AM   Julian Bates 24-Nov-1965 [id number]  Referring provider: Reginia Forts, MD  Chief Complaint  Patient presents with  . Erectile Dysfunction    HPI: 53 year old male seen in consultation at the request of Dr. Tamala Julian for evaluation of erectile dysfunction.  A [place] interpreter was utilized during this visit.  He states approximately 2 years ago when having intercourse with his wife he lost the erection mid intercourse which he states "got to him" and after that had difficulty achieving and maintaining an erection.  He subsequently divorced and recently started a new relationship.  The initial encounters were accomplished via video and subsequently they were unclothed and he states he obtained a rigid erection which he perceived as normal.  They subsequently attempted intercourse and he again was able to achieve penetration but lost the erection and since that time has had difficulty maintaining an erection.  He does admit to being very bothered by his inability to achieve successful intercourse.    He denies pain or curvature with erections.  He tried sildenafil several years ago and had significant flushing and headache and was unable to tolerate.  He denies tiredness, fatigue or decreased libido.  Organic risk factors include hypertension and antihypertensive medications including atenolol and enalapril.   PMH: Past Medical History:  Diagnosis Date  . Allergic conjunctivitis   . Arthritis   . Cataract   . Degenerative arthritis 04/20/2013  . Hypertension   . NAFLD (nonalcoholic fatty liver disease) 06/06/2015  . Seborrhea   . Thrombocytopenia [facility])     Surgical History: Past Surgical History:  Procedure Laterality Date  . COLONOSCOPY    . HEMORRHOID BANDING    . HERNIA REPAIR     x 3    Home Medications:  Allergies as of 04/24/2019      Reactions   Tramadol Nausea Only      Medication List       Accurate as of April 24, 2019  9:38  AM. If you have any questions, ask your nurse or doctor.        atenolol 50 MG tablet Commonly known as: TENORMIN TAKE ONE (1) TABLET BY MOUTH EVERY DAY TOME UNA TABLETA POR LA BOCA CADA DIA   desonide 0.05 % cream Commonly known as: DESOWEN APPLY TO AFFECTED AREA TWICE A DAY   enalapril 20 MG tablet Commonly known as: VASOTEC Take 1 tablet (20 mg total) by mouth daily.   hydrocortisone 2.5 % cream Apply topically.   hydrocortisone 25 MG suppository Commonly known as: Anucort-HC INSERT ONE SUPPOSITORY RECTALLY TWICE DAILY   ketoconazole 2 % shampoo Commonly known as: NIZORAL Apply 1 application topically 2 (two) times a week.   Olopatadine HCl 0.2 % Soln Apply 1 drop to eye daily.   OMEGA-3 FISH OIL/VITAMIN D3 PO Take by mouth.   valACYclovir 500 MG tablet Commonly known as: VALTREX TOME UNA TABLETA CADA DIA       Allergies:  Allergies  Allergen Reactions  . Tramadol Nausea Only    Family History: Family History  Problem Relation Age of Onset  . Arthritis Mother   . Diabetes Mother   . Hypertension Mother   . Arthritis Father   . Prostate cancer Father   . Stroke Father        CVA x 5  . Hypertension Father   . Diabetes Father        with leg ampuation  . Cancer Father 20  prostate cancer  . Diabetes Brother   . HIV Brother   . Colon cancer Maternal Grandfather     Social History:  reports that he has never smoked. He has never used smokeless tobacco. He reports current alcohol use. He reports that he does not use drugs.  ROS: UROLOGY Frequent Urination?: No Hard to postpone urination?: Yes Burning/pain with urination?: No Get up at night to urinate?: No Leakage of urine?: Yes Urine stream starts and stops?: No Trouble starting stream?: No Do you have to strain to urinate?: Yes Blood in urine?: No Urinary tract infection?: No Sexually transmitted disease?: Yes Injury to kidneys or bladder?: No Painful intercourse?: No Weak  stream?: No Erection problems?: Yes Penile pain?: No  Gastrointestinal Nausea?: No Vomiting?: No Indigestion/heartburn?: No Diarrhea?: No Constipation?: No  Constitutional Fever: No Night sweats?: No Weight loss?: No Fatigue?: No  Skin Skin rash/lesions?: No Itching?: Yes  Eyes Blurred vision?: Yes Double vision?: No  Ears/Nose/Throat Sore throat?: No Sinus problems?: No  Hematologic/Lymphatic Swollen glands?: No Easy bruising?: No  Cardiovascular Leg swelling?: No Chest pain?: No  Respiratory Cough?: No Shortness of breath?: Yes  Endocrine Excessive thirst?: No  Musculoskeletal Back pain?: No Joint pain?: Yes  Neurological Headaches?: Yes Dizziness?: No  Psychologic Depression?: No Anxiety?: No  Physical Exam: BP 122/75 (BP Location: Left Arm, Patient Position: Sitting, Cuff Size: Normal)   Pulse 63   Ht 5\' 6"  (1.676 m)   Wt 151 lb 8 oz (68.7 kg)   BMI 24.45 kg/m   Constitutional:  Alert and oriented, No acute distress. HEENT: [place] AT, moist mucus membranes.  Trachea midline, no masses. Cardiovascular: No clubbing, cyanosis, or edema. Respiratory: Normal respiratory effort, no increased work of breathing. GI: Abdomen is soft, nontender, nondistended, no abdominal masses GU: No CVA tenderness.  Penis uncircumcised without lesions or plaques.  Testes descended bilaterally without masses or tenderness.  Cord/epididymis palpably normal bilaterally. Lymph: No cervical or inguinal lymphadenopathy. Skin: No rashes, bruises or suspicious lesions. Neurologic: Grossly intact, no focal deficits, moving all 4 extremities. Psychiatric: Normal mood and affect.   Assessment & Plan:   53 year old male with erectile dysfunction.  Organic risk factors include hypertension and antihypertensive medications.  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  I recommended checking a  morning testosterone level and he was interested in a trial of tadalafil.  If his testosterone level is normal and tadalafil is not effective discussed intracavernosal erections versus penile Doppler studies to assess for abnormal arterial inflow/venous outflow.   Riki AltesScott C Stoioff, MD  [facility] [address], [address] [place], [place] [phone]

## 2019-04-25 LAB — TESTOSTERONE: Testosterone: 283 ng/dL (ref 264–916)

## 2019-04-27 ENCOUNTER — Telehealth: Payer: Self-pay | Admitting: *Deleted

## 2019-04-27 NOTE — Telephone Encounter (Addendum)
Left VM for patient to return call via Interpreter services.   ----- Message from Abbie Sons, MD sent at 04/27/2019 11:00 AM EDT ----- Testosterone level was normal.  Needs interpreter

## 2019-06-16 ENCOUNTER — Telehealth: Payer: Self-pay

## 2019-06-16 DIAGNOSIS — N5203 Combined arterial insufficiency and corporo-venous occlusive erectile dysfunction: Secondary | ICD-10-CM

## 2019-06-16 NOTE — Telephone Encounter (Signed)
Pt's wife calls and states that tadalafil has been ineffective for patient. They would like to know the step and other options. Looks like your last notes mentions doppler and injections. Do you want the patient to come in and see you after u/s? Come in for injection teaching? Please advise

## 2019-06-17 NOTE — Telephone Encounter (Signed)
He had normal erections when not attempting intercourse and this is suspicious for psychogenic ED.  I would recommend either sexual counseling or Doppler studies.  Would need to refer to [facility] for Doppler studies.  If they do not desire either of these options I would recommend obtaining a second opinion.

## 2019-06-19 NOTE — Telephone Encounter (Signed)
Pt's wife called office and I read message from [facility].  (wife is on [place]) They would like to know more information about Doppler studies before getting a referral to [facility].

## 2019-06-20 NOTE — Telephone Encounter (Signed)
Are the Doppler studies to check blood flow to the penis? Just want to make sure I'm giving the correct info thanks

## 2019-06-20 NOTE — Telephone Encounter (Signed)
Yes, to see if there is any abnormal blood flow

## 2019-06-20 NOTE — Telephone Encounter (Signed)
Incoming call from wife, questioning why the doppler studies would need to be done. Explained to patient that doppler studies would give information on adequate blood flow. She states that patient would like to proceed. Referral placed.

## 2019-06-30 ENCOUNTER — Other Ambulatory Visit: Payer: Self-pay | Admitting: Family Medicine

## 2019-06-30 DIAGNOSIS — Z8619 Personal history of other infectious and parasitic diseases: Secondary | ICD-10-CM

## 2019-07-04 ENCOUNTER — Other Ambulatory Visit: Payer: Self-pay | Admitting: Family Medicine

## 2019-07-04 DIAGNOSIS — Z8619 Personal history of other infectious and parasitic diseases: Secondary | ICD-10-CM

## 2019-07-05 ENCOUNTER — Other Ambulatory Visit: Payer: Self-pay | Admitting: Family Medicine

## 2019-07-05 DIAGNOSIS — Z8619 Personal history of other infectious and parasitic diseases: Secondary | ICD-10-CM

## 2019-07-05 NOTE — Telephone Encounter (Signed)
[facility] didn't receive script. Will resend

## 2021-11-04 ENCOUNTER — Encounter (INDEPENDENT_AMBULATORY_CARE_PROVIDER_SITE_OTHER): Payer: Managed Care, Other (non HMO) | Admitting: Vascular Surgery

## 2021-11-04 ENCOUNTER — Encounter (INDEPENDENT_AMBULATORY_CARE_PROVIDER_SITE_OTHER): Payer: Managed Care, Other (non HMO)

## 2021-11-04 ENCOUNTER — Other Ambulatory Visit (INDEPENDENT_AMBULATORY_CARE_PROVIDER_SITE_OTHER): Payer: Self-pay | Admitting: Nurse Practitioner

## 2021-11-04 ENCOUNTER — Encounter (INDEPENDENT_AMBULATORY_CARE_PROVIDER_SITE_OTHER): Payer: Self-pay

## 2021-11-04 DIAGNOSIS — R209 Unspecified disturbances of skin sensation: Secondary | ICD-10-CM

## 2022-09-07 ENCOUNTER — Encounter (INDEPENDENT_AMBULATORY_CARE_PROVIDER_SITE_OTHER): Payer: Self-pay

## 2022-10-20 ENCOUNTER — Other Ambulatory Visit: Payer: Self-pay | Admitting: Sports Medicine

## 2022-10-20 DIAGNOSIS — G8929 Other chronic pain: Secondary | ICD-10-CM

## 2022-10-29 ENCOUNTER — Ambulatory Visit
Admission: RE | Admit: 2022-10-29 | Discharge: 2022-10-29 | Disposition: A | Discharge status: Home / Self Care | Payer: BC Managed Care – PPO | Source: Ambulatory Visit | Attending: Sports Medicine | Admitting: Sports Medicine

## 2022-10-29 DIAGNOSIS — G8929 Other chronic pain: Secondary | ICD-10-CM

## 2022-11-13 ENCOUNTER — Other Ambulatory Visit: Payer: Self-pay | Admitting: Orthopedic Surgery

## 2022-11-18 ENCOUNTER — Encounter
Admission: RE | Admit: 2022-11-18 | Discharge: 2022-11-18 | Disposition: A | Discharge status: Home / Self Care | Payer: Managed Care, Other (non HMO) | Source: Ambulatory Visit | Attending: Orthopedic Surgery | Admitting: Orthopedic Surgery

## 2022-11-18 VITALS — Ht 66.5 in | Wt 160.0 lb

## 2022-11-18 DIAGNOSIS — I10 Essential (primary) hypertension: Secondary | ICD-10-CM

## 2022-11-18 HISTORY — DX: Nausea with vomiting, unspecified: R11.2

## 2022-11-18 HISTORY — DX: Other specified postprocedural states: Z98.890

## 2022-11-18 NOTE — Patient Instructions (Addendum)
Your procedure is scheduled on: Tuesday November 24, 2022. Su procedimiento est programado para: Martes 16 de Enero del 2024.  Report to Day Surgery inside [facility] 2nd floor stop by registration desk before getting on elevator. Presntese a: Educational psychologist escritorio a su derecha antes de subir al elevador. To find out your arrival time please call [phone] between 1PM - 3PM on Monday November 23, 2022. Para saber su hora de llegada por favor llame al [phone] entre la 1PM - 3PM el da: Lunes 15 de Enero del 2024.  Remember: Instructions that are not followed completely may result in serious medical risk, up to and including death,  or upon the discretion of your surgeon and anesthesiologist your surgery may need to be rescheduled.  Recuerde: Las instrucciones que no se siguen completamente Armed forces logistics/support/administrative officer en un riesgo de salud grave, incluyendo hasta  la [place] o a discrecin de su cirujano y Scientific laboratory technician, su ciruga se puede posponer.   __X_ 1.Do not eat food after midnight the night before your procedure. No    gum chewing or hard candies. You may drink clear liquids up to 2 hours     before you are scheduled to arrive for your surgery- DO not drink clear     Liquids within 2 hours of the start of your surgery.     Clear Liquids include:    water, apple juice without pulp, clear carbohydrate drink such as    Clearfast of [place]art[place]ade, Black Coffee or Tea (Do not add anything to coffee or tea).      No coma nada despus de la medianoche de la noche anterior a su    procedimiento. No coma chicles ni caramelos duros. Puede tomar    lquidos claros hasta 2 horas antes de su hora programada de llegada al     hospital para su procedimiento. No tome lquidos claros durante el     transcurso de las 2 horas de su llegada programada al hospital para su     procedimiento, ya que esto puede llevar a que su procedimiento se     retrase o tenga que volver a Magazine features editor.  Los lquidos claros incluyen:          - Agua o jugo de [place] sin pulpa          - Bebidas claras con carbohidratos como ClearFast o Gatorade          - Caf negro o t claro (sin leche, sin cremas, no agregue nada al caf ni al t)  No tome nada que no est en esta lista.  Los pacientes con diabetes tipo 1 y tipo 2 solo deben Printmaker.  Llame a la clnica de PreCare o a la unidad de Same Day Surgery si  tiene alguna pregunta sobre estas instrucciones.   _X__ 2. Complete the "Ensure Clear Pre-surgery Clear Carbohydrate Drink" provided to you, 2 hours before arrival. **If you  are diabetic you will be provided with an alternative drink, Gatorade Zero or G2.  Complete la "Bebida de carbohidratos claros antes de la ciruga Asegrese de que se le proporcione", 2 horas antes de la llegada. **Si es diabtico se le proporcionar una bebida alternativa, Gatorade Zero o G2.               _X__ 3.Do Not Smoke or use e-cigarettes For 24 Hours Prior to Your Surgery.    Do not use any chewable  tobacco products for at least 6   hours prior to surgery.    No fume ni use cigarrillos electrnicos durante las 24 horas previas    a su [place].  No use ningn producto de tabaco masticable durante   al menos 6 horas antes de la [place].     __X_ 4. No alcohol for 24 hours before or after surgery.    No tome alcohol durante las 24 horas antes ni despus de la [place].   __X__5. On the morning of surgery brush your teeth with toothpaste and water, you                may rinse your mouth with mouthwash if you wish.  Do not swallow any toothpaste of mouthwash.   En la maana de la [place], cepllese los dientes con pasta de dientes y [place],                [place] enjuagarse la boca con enjuague bucal si lo desea. No ingiera ninguna pasta de dientes o enjuague bucal.   __X__ 6. Notify your doctor if there is any change in your medical condition (cold,fever,  infections).    Informe a su mdico si hay algn cambio en su condicin mdica  (resfriado, fiebre, infecciones).   Do not wear jewelry, make-up, hairpins, clips or nail polish.  No use joyas, maquillajes, pinzas/ganchos para el cabello ni esmalte de uas.  Do not wear lotions, powders, or perfumes or deodorant.  No use lociones, polvos o perfumes o desodorante.    Do not shave 48 hours prior to surgery. Men may shave face and neck.  No se afeite 48 horas antes de la [place].  Los hombres pueden [employer] cara  y el cuello.   Do not bring valuables to the hospital.   No lleve objetos [facility] is not responsible for any belongings or valuables.  [place] no se hace responsable de ningn tipo de pertenencias u objetos de Geographical information systems officer.               Contacts, dentures or bridgework may not be worn into surgery.  Los lentes de [place], las dentaduras postizas o puentes no se pueden usar en la [place].   Leave your suitcase in the car. After surgery it may be brought to your room.  Deje su maleta en el auto.  Despus de la ciruga podr traerla a su habitacin.   For patients admitted to the hospital, discharge time is determined by your  treatment team.  Para los pacientes que sean ingresados al hospital, el tiempo en el cual se le  dar de alta es determinado por su equipo de [place].   Patients discharged the day of surgery will not be allowed to drive home. A los pacientes que se les da de alta el mismo da de la ciruga no se les permitir conducir a Holiday representative.    __X__ Take these medicines the morning of surgery with A SIP OF WATER:          [employer] estas medicinas la maana de la ciruga con UN SORBO DE AGUA:  1. None   2.   3.   4.       5.  6.  ____ Fleet Enema (as directed)          Enema de [place] (segn lo indicado)    __X__ Use CHG Soap as directed  Utilice el jabn de CHG segn lo indicado  ____ Use inhalers on the day of surgery           Use los inhaladores el da de la ciruga  ____ Stop metformin 2 days prior to surgery          Deje de tomar el metformin 2 das antes de la ciruga    ____ Take 1/2 of usual insulin dose the night before surgery and none on the morning of surgery           Tome la mitad de la dosis habitual de insulina la noche antes de la [place] y no tome nada en la maana de la             ciruga  __X__ Stop Anti-inflammatories such as Ibuprofen, Aleve, Advil, Motrin, Naprosyn, Meloxicam, Lodine, Ketoralac, Midol, and aspirin containing products like Excedrin, aspirin, Goody's and or BC Powders.          Deje de tomar antiinflamatorios como Ibuprofen, Aleve, Advil, Motrin, Naprosyn, Meloxicam, Lodine, Ketoralac, Midol, o productos con aspirina como Excedrin, Goody's and or BC Powders.   __X__ Stop supplements until after surgery  ascorbic acid (VITAMIN C), Fish Oil-Cholecalciferol           Deje de tomar suplementos hasta despus de la ciruga ascorbic acid (VITAMIN C), Fish Oil-Cholecalciferol   ____ Bring C-Pap to the hospital          Lleve el C-Pap al hospital     If you have any questions regarding your pre-procedure instructions,  Please call Pre-admit Testing at [phone]  Si tiene alguna pregunta con respecto a las instrucciones previas al procedimiento, Llame a Pruebas previas a la admisin al [phone] Preparacin para la ciruga con jabn de GLUCONATO DE CLORHEXIDINA (CHG)  Jabn de gluconato de clorhexidina (CHG)  o Un limpiador antisptico que [employer] grmenes y se adhiere a la piel para seguir [employer] grmenes incluso despus del lavado.  o Se utiliza para ducharse la noche anterior a la [place] y la maana de la [place].  Antes de la [place], usted puede desempear un papel importante al reducir la cantidad de grmenes en su piel. El jabn CHG (gluconato de clorhexidina) es un limpiador antisptico que mata los grmenes y se adhiere a la piel para continuar  matndolos incluso despus del lavado.  No lo utilice si es alrgico al CHG o a los jabones antibacterianos. Si su piel se enrojece o irrita, deje de usar CHG.  1. Ducharse la NOC[place] ANTES DE LA CIRUGA y la [place] DE LA CIRUGA con jabn CHG.  2. Si eliges lavarte el cabello, lvalo primero como de costumbre con tu champ habitual.  3. Despus del champ, enjuague bien el cabello y el cuerpo para eliminar el champ.  4. Utilice CHG como lo hara con cualquier otro jabn lquido. Puede aplicar CHG directamente sobre la piel y lavar suavemente con un pauelo o una toallita limpia.  5. Aplique el jabn CHG en su cuerpo nicamente desde el cuello hacia abajo. No utilizar en heridas abiertas o llagas abiertas. Evite el contacto con los ojos, odos, boca y genitales (partes privadas). Lvese la cara y los genitales (partes privadas) con su jabn habitual.  6. Lvese bien, prestando especial atencin al rea donde se realizar su ciruga.  7. Enjuague bien su cuerpo con agua tibia.  8. No se duche ni se lave con su jabn normal despus de usar y enjuagar el jabn CHG.  9. Squese dando palmaditas con una toalla limpia.  10. Use pijamas limpios para dormir la noche anterior a la ciruga.  12. Coloque sbanas limpias en su cama la noche de su primera ducha y no duerma con mascotas.  13. Ducharse nuevamente con el jabn CHG el da de la ciruga antes de llegar al hospital.  14. No aplique desodorantes, lociones o polvos.  15. Por favor use ropa limpia al hospital.

## 2022-11-19 ENCOUNTER — Encounter
Admission: RE | Admit: 2022-11-19 | Discharge: 2022-11-19 | Disposition: A | Discharge status: Home / Self Care | Payer: BC Managed Care – PPO | Source: Ambulatory Visit | Attending: Orthopedic Surgery | Admitting: Orthopedic Surgery

## 2022-11-19 DIAGNOSIS — I10 Essential (primary) hypertension: Secondary | ICD-10-CM | POA: Insufficient documentation

## 2022-11-19 DIAGNOSIS — Z01818 Encounter for other preprocedural examination: Secondary | ICD-10-CM | POA: Insufficient documentation

## 2022-11-19 LAB — CBC
HCT: 46.2 % (ref 39.0–52.0)
Hemoglobin: 16.4 g/dL (ref 13.0–17.0)
MCH: 31.6 pg (ref 26.0–34.0)
MCHC: 35.5 g/dL (ref 30.0–36.0)
MCV: 89 fL (ref 80.0–100.0)
Platelets: 149 10*3/uL — ABNORMAL LOW (ref 150–400)
RBC: 5.19 MIL/uL (ref 4.22–5.81)
RDW: 12.1 % (ref 11.5–15.5)
WBC: 7.9 10*3/uL (ref 4.0–10.5)
nRBC: 0 % (ref 0.0–0.2)

## 2022-11-19 LAB — BASIC METABOLIC PANEL
Anion gap: 11 (ref 5–15)
BUN: 19 mg/dL (ref 6–20)
CO2: 24 mmol/L (ref 22–32)
Calcium: 9.4 mg/dL (ref 8.9–10.3)
Chloride: 101 mmol/L (ref 98–111)
Creatinine, Ser: 0.74 mg/dL (ref 0.61–1.24)
GFR, Estimated: >60 mL/min (ref >60)
Glucose, Bld: 86 mg/dL (ref 70–99)
Potassium: 3.4 mmol/L — ABNORMAL LOW (ref 3.5–5.1)
Sodium: 136 mmol/L (ref 135–145)

## 2022-11-23 MED ORDER — FAMOTIDINE 20 MG PO TABS: 20.0000 mg | ORAL_TABLET | Freq: Once | ORAL | Status: AC

## 2022-11-23 MED ORDER — ORAL CARE MOUTH RINSE: 15.0000 mL | Freq: Once | OROMUCOSAL | Status: AC

## 2022-11-23 MED ORDER — LACTATED RINGERS IV SOLN: INTRAVENOUS | Status: DC

## 2022-11-23 MED ORDER — CHLORHEXIDINE GLUCONATE 0.12 % MT SOLN: 15.0000 mL | Freq: Once | OROMUCOSAL | Status: AC

## 2022-11-23 MED ORDER — CEFAZOLIN SODIUM-DEXTROSE 2-4 GM/100ML-% IV SOLN
2.0000 g | INTRAVENOUS | Status: AC
  Administered 2022-11-24: 2 g via INTRAVENOUS

## 2022-11-24 ENCOUNTER — Ambulatory Visit: Payer: BC Managed Care – PPO | Admitting: Urgent Care

## 2022-11-24 ENCOUNTER — Encounter: Payer: Self-pay | Admitting: Orthopedic Surgery

## 2022-11-24 ENCOUNTER — Encounter
Admission: RE | Disposition: A | Discharge status: Home / Self Care | Payer: Self-pay | Source: Home / Self Care | Attending: Orthopedic Surgery

## 2022-11-24 ENCOUNTER — Ambulatory Visit: Payer: BC Managed Care – PPO

## 2022-11-24 ENCOUNTER — Other Ambulatory Visit: Payer: Self-pay

## 2022-11-24 ENCOUNTER — Ambulatory Visit
Admission: RE | Admit: 2022-11-24 | Discharge: 2022-11-24 | Disposition: A | Discharge status: Home / Self Care | Payer: BC Managed Care – PPO | Attending: Orthopedic Surgery | Admitting: Orthopedic Surgery

## 2022-11-24 DIAGNOSIS — M19011 Primary osteoarthritis, right shoulder: Secondary | ICD-10-CM | POA: Insufficient documentation

## 2022-11-24 DIAGNOSIS — M75101 Unspecified rotator cuff tear or rupture of right shoulder, not specified as traumatic: Secondary | ICD-10-CM | POA: Diagnosis not present

## 2022-11-24 DIAGNOSIS — M25811 Other specified joint disorders, right shoulder: Secondary | ICD-10-CM | POA: Insufficient documentation

## 2022-11-24 DIAGNOSIS — I10 Essential (primary) hypertension: Secondary | ICD-10-CM | POA: Diagnosis not present

## 2022-11-24 DIAGNOSIS — M67813 Other specified disorders of tendon, right shoulder: Secondary | ICD-10-CM | POA: Diagnosis not present

## 2022-11-24 SURGERY — SHOULDER ARTHROSCOPY WITH SUBACROMIAL DECOMPRESSION AND DISTAL CLAVICLE EXCISION
Anesthesia: General | Site: Shoulder | Laterality: Right

## 2022-11-24 MED ORDER — NEOMYCIN-POLYMYXIN B GU 40-200000 IR SOLN
Status: AC
  Filled 2022-11-24: qty 20

## 2022-11-24 MED ORDER — BUPIVACAINE HCL (PF) 0.5 % IJ SOLN
INTRAMUSCULAR | Status: DC | PRN
  Administered 2022-11-24: 10 mL via PERINEURAL

## 2022-11-24 MED ORDER — FENTANYL CITRATE PF 50 MCG/ML IJ SOSY
PREFILLED_SYRINGE | INTRAMUSCULAR | Status: AC
  Administered 2022-11-24: 50 ug
  Filled 2022-11-24: qty 1

## 2022-11-24 MED ORDER — MIDAZOLAM HCL 2 MG/2ML IJ SOLN
INTRAMUSCULAR | Status: AC
  Administered 2022-11-24: 1 mg
  Filled 2022-11-24: qty 2

## 2022-11-24 MED ORDER — LIDOCAINE HCL (PF) 2 % IJ SOLN
INTRAMUSCULAR | Status: AC
  Filled 2022-11-24: qty 5

## 2022-11-24 MED ORDER — MIDAZOLAM HCL 2 MG/2ML IJ SOLN: 1.0000 mg | Freq: Once | INTRAMUSCULAR | Status: AC

## 2022-11-24 MED ORDER — CEFAZOLIN SODIUM-DEXTROSE 2-4 GM/100ML-% IV SOLN
INTRAVENOUS | Status: AC
  Filled 2022-11-24: qty 100

## 2022-11-24 MED ORDER — SUGAMMADEX SODIUM 200 MG/2ML IV SOLN
INTRAVENOUS | Status: DC | PRN
  Administered 2022-11-24: 200 mg via INTRAVENOUS

## 2022-11-24 MED ORDER — MIDAZOLAM HCL 2 MG/2ML IJ SOLN
1.0000 mg | Freq: Once | INTRAMUSCULAR | Status: AC
  Administered 2022-11-24: 1 mg via INTRAVENOUS

## 2022-11-24 MED ORDER — LIDOCAINE HCL (PF) 1 % IJ SOLN
INTRAMUSCULAR | Status: DC | PRN
  Administered 2022-11-24: 4 mL via SUBCUTANEOUS

## 2022-11-24 MED ORDER — ASPIRIN 325 MG PO TBEC: 325.0000 mg | DELAYED_RELEASE_TABLET | Freq: Every day | ORAL | 0 refills | Status: AC

## 2022-11-24 MED ORDER — DEXAMETHASONE SODIUM PHOSPHATE 10 MG/ML IJ SOLN
INTRAMUSCULAR | Status: AC
  Filled 2022-11-24: qty 1

## 2022-11-24 MED ORDER — PROPOFOL 10 MG/ML IV BOLUS
INTRAVENOUS | Status: AC
  Filled 2022-11-24: qty 20

## 2022-11-24 MED ORDER — DEXAMETHASONE SODIUM PHOSPHATE 10 MG/ML IJ SOLN
INTRAMUSCULAR | Status: DC | PRN
  Administered 2022-11-24: 10 mg via INTRAVENOUS

## 2022-11-24 MED ORDER — FENTANYL CITRATE (PF) 100 MCG/2ML IJ SOLN
INTRAMUSCULAR | Status: AC
  Filled 2022-11-24: qty 2

## 2022-11-24 MED ORDER — LIDOCAINE HCL (CARDIAC) PF 100 MG/5ML IV SOSY
PREFILLED_SYRINGE | INTRAVENOUS | Status: DC | PRN
  Administered 2022-11-24: 100 mg via INTRAVENOUS

## 2022-11-24 MED ORDER — ACETAMINOPHEN 500 MG PO TABS: 1000.0000 mg | ORAL_TABLET | Freq: Three times a day (TID) | ORAL | 2 refills | Status: AC

## 2022-11-24 MED ORDER — BUPIVACAINE LIPOSOME 1.3 % IJ SUSP
INTRAMUSCULAR | Status: DC | PRN
  Administered 2022-11-24: 20 mL via PERINEURAL

## 2022-11-24 MED ORDER — ACETAMINOPHEN 10 MG/ML IV SOLN
INTRAVENOUS | Status: DC | PRN
  Administered 2022-11-24: 1000 mg via INTRAVENOUS

## 2022-11-24 MED ORDER — BUPIVACAINE LIPOSOME 1.3 % IJ SUSP
INTRAMUSCULAR | Status: AC
  Filled 2022-11-24: qty 20

## 2022-11-24 MED ORDER — PHENYLEPHRINE 80 MCG/ML (10ML) SYRINGE FOR IV PUSH (FOR BLOOD PRESSURE SUPPORT)
PREFILLED_SYRINGE | INTRAVENOUS | Status: AC
  Filled 2022-11-24: qty 10

## 2022-11-24 MED ORDER — ROCURONIUM BROMIDE 10 MG/ML (PF) SYRINGE
PREFILLED_SYRINGE | INTRAVENOUS | Status: AC
  Filled 2022-11-24: qty 10

## 2022-11-24 MED ORDER — FENTANYL CITRATE (PF) 100 MCG/2ML IJ SOLN
INTRAMUSCULAR | Status: DC | PRN
  Administered 2022-11-24 (×2): 50 ug via INTRAVENOUS

## 2022-11-24 MED ORDER — EPINEPHRINE PF 1 MG/ML IJ SOLN
INTRAMUSCULAR | Status: AC
  Filled 2022-11-24: qty 1

## 2022-11-24 MED ORDER — CHLORHEXIDINE GLUCONATE 0.12 % MT SOLN
OROMUCOSAL | Status: AC
  Administered 2022-11-24: 15 mL via OROMUCOSAL
  Filled 2022-11-24: qty 15

## 2022-11-24 MED ORDER — LIDOCAINE HCL (PF) 1 % IJ SOLN
INTRAMUSCULAR | Status: AC
  Filled 2022-11-24: qty 30

## 2022-11-24 MED ORDER — ONDANSETRON HCL 4 MG/2ML IJ SOLN
INTRAMUSCULAR | Status: DC | PRN
  Administered 2022-11-24: 4 mg via INTRAVENOUS

## 2022-11-24 MED ORDER — FENTANYL CITRATE (PF) 100 MCG/2ML IJ SOLN: 25.0000 ug | INTRAMUSCULAR | Status: DC | PRN

## 2022-11-24 MED ORDER — HYDROMORPHONE HCL 1 MG/ML IJ SOLN
INTRAMUSCULAR | Status: AC
  Filled 2022-11-24: qty 1

## 2022-11-24 MED ORDER — EPINEPHRINE PF 1 MG/ML IJ SOLN
INTRAMUSCULAR | Status: AC
  Filled 2022-11-24: qty 4

## 2022-11-24 MED ORDER — BUPIVACAINE-EPINEPHRINE (PF) 0.5% -1:200000 IJ SOLN
INTRAMUSCULAR | Status: AC
  Filled 2022-11-24: qty 30

## 2022-11-24 MED ORDER — ROCURONIUM BROMIDE 100 MG/10ML IV SOLN
INTRAVENOUS | Status: DC | PRN
  Administered 2022-11-24: 60 mg via INTRAVENOUS

## 2022-11-24 MED ORDER — BUPIVACAINE HCL (PF) 0.5 % IJ SOLN
INTRAMUSCULAR | Status: AC
  Filled 2022-11-24: qty 10

## 2022-11-24 MED ORDER — PROMETHAZINE HCL 25 MG/ML IJ SOLN: 6.2500 mg | INTRAMUSCULAR | Status: DC | PRN

## 2022-11-24 MED ORDER — FAMOTIDINE 20 MG PO TABS
ORAL_TABLET | ORAL | Status: AC
  Administered 2022-11-24: 20 mg via ORAL
  Filled 2022-11-24: qty 1

## 2022-11-24 MED ORDER — ACETAMINOPHEN 10 MG/ML IV SOLN
INTRAVENOUS | Status: AC
  Filled 2022-11-24: qty 100

## 2022-11-24 MED ORDER — MIDAZOLAM HCL 2 MG/2ML IJ SOLN
INTRAMUSCULAR | Status: AC
  Filled 2022-11-24: qty 2

## 2022-11-24 MED ORDER — PROPOFOL 10 MG/ML IV BOLUS
INTRAVENOUS | Status: DC | PRN
  Administered 2022-11-24: 140 mg via INTRAVENOUS

## 2022-11-24 MED ORDER — ONDANSETRON HCL 4 MG/2ML IJ SOLN
INTRAMUSCULAR | Status: AC
  Filled 2022-11-24: qty 2

## 2022-11-24 MED ORDER — PHENYLEPHRINE HCL (PRESSORS) 10 MG/ML IV SOLN
INTRAVENOUS | Status: DC | PRN
  Administered 2022-11-24 (×3): 80 ug via INTRAVENOUS

## 2022-11-24 MED ORDER — ONDANSETRON 4 MG PO TBDP: 4.0000 mg | ORAL_TABLET | Freq: Three times a day (TID) | ORAL | 0 refills | Status: AC | PRN

## 2022-11-24 MED ORDER — OXYCODONE HCL 5 MG PO TABS: 5.0000 mg | ORAL_TABLET | ORAL | 0 refills | Status: AC | PRN

## 2022-11-24 MED ORDER — LACTATED RINGERS IV SOLN
INTRAVENOUS | Status: DC | PRN
  Administered 2022-11-24: 12000 mL

## 2022-11-24 MED ORDER — FENTANYL CITRATE PF 50 MCG/ML IJ SOSY: 50.0000 ug | PREFILLED_SYRINGE | Freq: Once | INTRAMUSCULAR | Status: DC

## 2022-11-24 MED ORDER — LIDOCAINE HCL (PF) 1 % IJ SOLN
INTRAMUSCULAR | Status: AC
  Filled 2022-11-24: qty 5

## 2022-11-24 MED ORDER — RINGERS IRRIGATION IR SOLN
Status: DC | PRN
  Administered 2022-11-24 (×2): 3000 mL

## 2022-11-24 SURGICAL SUPPLY — 83 items
ADAPTER IRRIG TUBE 2 SPIKE SOL (ADAPTER) ×2 IMPLANT
ADPR TBG 2 SPK PMP STRL ASCP (ADAPTER) ×2
ANCH SUT 2 SWLK 19.1 CLS EYLT (Anchor) ×1 IMPLANT
ANCH SUT 2.9 PUSHLOCK ANCH (Orthopedic Implant) ×1 IMPLANT
ANCH SUT 2X2.3 TAPE (Anchor) ×1 IMPLANT
ANCH SUT 5 3.9 CRKSW KNTLS (Anchor) ×1 IMPLANT
ANCHOR 2.3 SP SGL 1.2 XBRAID (Anchor) IMPLANT
ANCHOR 3.9 PEEK CORKSCREW 5MTS (Anchor) IMPLANT
ANCHOR SWIVELOCK BIO 4.75X19.1 (Anchor) IMPLANT
APL PRP STRL LF DISP 70% ISPRP (MISCELLANEOUS) ×1
BLADE SHAVER 4.5X7 STR FR (MISCELLANEOUS) ×1 IMPLANT
BRUSH SCRUB EZ  4% CHG (MISCELLANEOUS) ×1
BRUSH SCRUB EZ 4% CHG (MISCELLANEOUS) ×1 IMPLANT
BUR BR 5.5 WIDE MOUTH (BURR) IMPLANT
BUR RADIUS 5.5 (BURR) ×1 IMPLANT
CANNULA PART THRD DISP 5.75X7 (CANNULA) IMPLANT
CANNULA PARTIAL THREAD 2X7 (CANNULA) ×1 IMPLANT
CANNULA TWIST IN 8.25X7CM (CANNULA) IMPLANT
CANNULA TWIST IN 8.25X9CM (CANNULA) IMPLANT
CHLORAPREP W/TINT 26 (MISCELLANEOUS) ×1 IMPLANT
COOLER POLAR GLACIER W/PUMP (MISCELLANEOUS) ×1 IMPLANT
DEVICE SUCT BLK HOLE OR FLOOR (MISCELLANEOUS) ×2 IMPLANT
DRAPE 3/4 80X56 (DRAPES) ×1 IMPLANT
DRAPE INCISE IOBAN 66X45 STRL (DRAPES) ×1 IMPLANT
DRAPE STERI 35X30 U-POUCH (DRAPES) ×1 IMPLANT
DRAPE U-SHAPE 47X51 STRL (DRAPES) ×2 IMPLANT
DRSG TEGADERM 4X4.75 (GAUZE/BANDAGES/DRESSINGS) ×2 IMPLANT
ELECT REM PT RETURN 9FT ADLT (ELECTROSURGICAL)
ELECTRODE REM PT RTRN 9FT ADLT (ELECTROSURGICAL) IMPLANT
GAUZE SPONGE 4X4 12PLY STRL (GAUZE/BANDAGES/DRESSINGS) ×1 IMPLANT
GAUZE XEROFORM 1X8 LF (GAUZE/BANDAGES/DRESSINGS) ×1 IMPLANT
GLOVE BIO SURGEON STRL SZ7.5 (GLOVE) ×1 IMPLANT
GLOVE BIOGEL PI IND STRL 8 (GLOVE) ×2 IMPLANT
GLOVE SURG ORTHO 8.0 STRL STRW (GLOVE) ×1 IMPLANT
GLOVE SURG SYN 7.5  E (GLOVE) ×1
GLOVE SURG SYN 7.5 E (GLOVE) ×1 IMPLANT
GLOVE SURG SYN 7.5 PF PI (GLOVE) ×1 IMPLANT
GOWN STRL REUS W/ TWL LRG LVL3 (GOWN DISPOSABLE) ×2 IMPLANT
GOWN STRL REUS W/TWL LRG LVL3 (GOWN DISPOSABLE) ×2
GOWN STRL REUS W/TWL LRG LVL4 (GOWN DISPOSABLE) ×1 IMPLANT
IV LACTATED RINGER IRRG 3000ML (IV SOLUTION) ×8
IV LR IRRIG 3000ML ARTHROMATIC (IV SOLUTION) ×4 IMPLANT
KIT STABILIZATION SHOULDER (MISCELLANEOUS) ×1 IMPLANT
KIT SUTURETAK 3.0 INSERT PERC (KITS) ×1 IMPLANT
KIT TURNOVER KIT A (KITS) ×1 IMPLANT
MANIFOLD NEPTUNE II (INSTRUMENTS) ×2 IMPLANT
MASK FACE SPIDER DISP (MASK) ×1 IMPLANT
MAT ABSORB  FLUID 56X50 GRAY (MISCELLANEOUS) ×2
MAT ABSORB FLUID 56X50 GRAY (MISCELLANEOUS) ×2 IMPLANT
NDL SAFETY ECLIP 18X1.5 (MISCELLANEOUS) ×1 IMPLANT
NEEDLE HYPO 22GX1.5 SAFETY (NEEDLE) ×1 IMPLANT
NS IRRIG 500ML POUR BTL (IV SOLUTION) ×1 IMPLANT
PACK ARTHROSCOPY SHOULDER (MISCELLANEOUS) ×1 IMPLANT
PAD ABD DERMACEA PRESS 5X9 (GAUZE/BANDAGES/DRESSINGS) ×1 IMPLANT
PAD ARMBOARD 7.5X6 YLW CONV (MISCELLANEOUS) ×1 IMPLANT
PAD WRAPON POLAR SHDR XLG (MISCELLANEOUS) ×1 IMPLANT
PASSER SUT FIRSTPASS SELF (INSTRUMENTS) ×1 IMPLANT
SHAVER BLADE BONE CUTTER  5.5 (BLADE) ×1
SHAVER BLADE BONE CUTTER 5.5 (BLADE) IMPLANT
SLEEVE REMOTE CONTROL 5X12 (DRAPES) IMPLANT
SLING ULTRA II M (MISCELLANEOUS) ×1 IMPLANT
SPONGE T-LAP 18X18 ~~LOC~~+RFID (SPONGE) ×1 IMPLANT
STRAP SAFETY 5IN WIDE (MISCELLANEOUS) ×1 IMPLANT
SUT ETHILON 3-0 FS-10 30 BLK (SUTURE) ×1
SUT LASSO 90 DEG SD STR (SUTURE) ×1 IMPLANT
SUT MNCRL 4-0 (SUTURE) ×1
SUT MNCRL 4-0 27XMFL (SUTURE) ×1
SUT PDS AB 0 CT1 27 (SUTURE) IMPLANT
SUT VIC AB 0 CT1 36 (SUTURE) ×1 IMPLANT
SUT VIC AB 2-0 CT2 27 (SUTURE) ×1 IMPLANT
SUTURE EHLN 3-0 FS-10 30 BLK (SUTURE) ×1 IMPLANT
SUTURE MNCRL 4-0 27XMF (SUTURE) ×1 IMPLANT
SYR 10ML LL (SYRINGE) ×1 IMPLANT
SYSTEM IMPL TENODESIS LNT 2.9 (Orthopedic Implant) IMPLANT
TAPE CLOTH 3X10 WHT NS LF (GAUZE/BANDAGES/DRESSINGS) ×1 IMPLANT
TRAP FLUID SMOKE EVACUATOR (MISCELLANEOUS) ×1 IMPLANT
TUBING CONNECTING 10 (TUBING) ×1 IMPLANT
TUBING INFLOW SET DBFLO PUMP (TUBING) ×1 IMPLANT
TUBING OUTFLOW SET DBLFO PUMP (TUBING) ×1 IMPLANT
WAND WEREWOLF FLOW 90D (MISCELLANEOUS) ×1 IMPLANT
WATER STERILE IRR 500ML POUR (IV SOLUTION) ×1 IMPLANT
WRAP SHOULDER HOT/COLD PACK (SOFTGOODS) ×1 IMPLANT
WRAPON POLAR PAD SHDR XLG (MISCELLANEOUS) ×1

## 2022-11-24 NOTE — Op Note (Signed)
SURGERY DATE: 11/24/2022   PRE-OP DIAGNOSIS:  1. Right subacromial impingement 2. Right biceps tendinopathy 3. Right rotator cuff tear 4. Right acromioclavicular joint arthritis  POST-OP DIAGNOSIS: 1. Right subacromial impingement 2. Right biceps tendinopathy 3. Right rotator cuff tear 4. Right acromioclavicular joint arthritis  PROCEDURES:  1. Right arthroscopic rotator cuff repair (supraspinatus and upper border subscapularis) 2. Right arthroscopic biceps tenodesis 3. Right arthroscopic subacromial decompression 4. Right arthroscopic extensive debridement of shoulder (glenohumeral and subacromial spaces) 5. Right arthroscopic distal clavicle excision   SURGEON: Cato Mulligan, MD   ASSISTANT: Anitra Lauth, PA   ANESTHESIA: Gen with Exparel interscalene block   ESTIMATED BLOOD LOSS: 5cc   DRAINS:  none   TOTAL IV FLUIDS: per anesthesia      SPECIMENS: none   IMPLANTS:  - Arthrex 2.64mm PushLock x 1 - Arthrex 4.58mm SwiveLock x 1 - Arthrex 3.38mm Knotless Corkscrew x 1 - Iconix SPEED double loaded with 1.2 and 2.67mm tape x 1     OPERATIVE FINDINGS:  Examination under anesthesia: A careful examination under anesthesia was performed.  Passive range of motion was: FF: 150; ER at side: 60; ER in abduction: 100; IR in abduction: 45.  Anterior load shift: NT.  Posterior load shift: NT.  Sulcus in neutral: NT.  Sulcus in ER: NT.     Intra-operative findings: A thorough arthroscopic examination of the shoulder was performed.  The findings are: 1. Biceps tendon: tendinopathy with significant erythema  2. Superior labrum: erythema and degenerative fraying 3. Posterior labrum and capsule: normal 4. Inferior capsule and inferior recess: normal 5. Glenoid cartilage surface: Normal 6. Supraspinatus attachment: High-grade partial-thickness articular sided tear of the supraspinatus involving approximately 80% of the footprint 7. Posterior rotator cuff attachment: normal 8. Humeral  head articular cartilage: normal 9. Rotator interval: significant synovitis 10: Subscapularis tendon: Partial-thickness tear of the upper border 11. Anterior labrum: Mildly degenerative 12. IGHL: normal   OPERATIVE REPORT:    Indications for procedure:  Julian Bates is a 57 y.o. male with over 3 months of right shoulder pain.  He has had difficulty with overhead motion since that time with sensations of weakness.  He is also having significant pain at night affecting his sleep.  He is also having difficulty with work due to pain.  Clinical exam and MRI were suggestive of high-grade partial-thickness rotator cuff tear, biceps tendinopathy, acromioclavicular joint arthritis and subacromial impingement. After discussion of risks, benefits, and alternatives to surgery, the patient elected to proceed.    Procedure in detail:   I identified Julian Bates in the pre-operative holding area.  I marked the operative shoulder with my initials. I reviewed the risks and benefits of the proposed surgical intervention, and the patient wished to proceed.  Anesthesia was then performed with an Exparel interscalene block.  The patient was transferred to the operative suite and placed in the beach chair position.     Appropriate IV antibiotics were administered prior to incision. The operative upper extremity was then prepped and draped in standard fashion. A time out was performed confirming the correct extremity, correct patient, and correct procedure.    I then created a standard posterior portal with an 11 blade. The glenohumeral joint was easily entered with a blunt trocar and the arthroscope introduced. The findings of diagnostic arthroscopy are described above. I debrided degenerative tissue including the synovitic tissue about the rotator interval, the anterior labrum, and the superior labrum. I then coagulated the inflamed synovium to obtain  hemostasis and reduce the risk of post-operative  swelling using an Arthrocare radiofrequency device.   I then turned my attention to the arthroscopic biceps tenodesis. The Loop n Tack technique was used to pass a FiberTape through the biceps in a locked fashion adjacent to the biceps anchor.  A hole for a 2.9 mm Arthrex PushLock was drilled in the bicipital groove just superior to the subscapularis tendon insertion.  The biceps tendon was then cut and the biceps anchor complex was debrided down to a stable base on the superior labrum.  The FiberTape was loaded onto the PushLock anchor and impacted into place into the previously drilled hole in the bicipital groove.  This appropriately secured the biceps into the bicipital groove and took it off of tension.   Next, arthroscopic repair of the subscapularis was performed. The lesser tuberosity footprint was prepared with a combination of electrocautery and an arthroscopic curette.  An Arthrex knotless corkscrew was placed into the lesser tuberosity footprint from the anterior portal.  A BirdBeak was used to shuttle the repair suture through the upper border of the subscapularis tendon.  The suture was then shuttled through the anchor. With the arm in neutral rotation, the repair was tensioned appropriately. This appropriately reduced the subscapularis tear.  The arm was then internally and externally rotated and the subscapularis was noted to move appropriately with rotation.  The remainder of the suture was then cut.  Next, the arthroscope was then introduced into the subacromial space. A direct lateral portal was created with an 11-blade after spinal needle localization. An extensive subacromial bursectomy and debridement was performed using a combination of the shaver and Arthrocare wand. The entire acromial undersurface was exposed and the CA ligament was subperiosteally elevated to expose the anterior acromial hook. A burr was used to create a flat anterior and lateral aspect of the acromion, converting it  from a Type 2 to a Type 1 acromion. Care was made to keep the deltoid fascia intact.  I then turned my attention to the arthroscopic distal clavicle excision. I identified the acromioclavicular joint. Surrounding bursal tissue was debrided and the edges of the joint were identified. I used the 5.24mm barrel burr to remove the distal clavicle parallel to the edge of the acromion. I was able to fit two widths of the burr into the space between the distal clavicle and acromion, signifying that I had removed ~69mm of distal clavicle. This was confirmed by viewing anteriorly and introducing a probe with measuring marks from the lateral portal. Hemostasis was achieved with an Arthrocare wand.    Next, I created an accessory posterolateral portal to assist with visualization and instrumentation.  I was able to easily pass a switching stick through the bursal side of the supraspinatus in the region of the high-grade partial-thickness tear, thus completing the tear.  I debrided the poor quality edges of the supraspinatus tendon.  This was a U-shaped tear of the supraspinatus.   I then percutaneously placed 1 Iconix SPEED medial row anchor at the articular margin.  The shaft of the anchor inserter was impacted into the greater tuberosity footprint in multiple locations to allow for marrow venting.  I then shuttled all 4 strands of tape through the rotator cuff just lateral to the musculotendinous junction using a FirstPass suture passer spanning the anterior to posterior extent of the tear. All 4 strands of suture were passed through an [employer] anchor.  This was placed approximately 2 cm distal to the lateral  edge of the footprint in line with the midportion of the tear with appropriate tensioning of each suture prior to final fixation. There was 1 small dogear one anteriorly.  The knotless mechanism of the SwiveLock anchor was utilized to reduce the dogear. This construct allowed for excellent reapproximation  of the rotator cuff to its native footprint without undue tension.  Appropriate compression was achieved. The repair was stable to external and internal rotation.   Fluid was evacuated from the shoulder, and the portals were closed with 3-0 Nylon. Xeroform was applied to the portals. A sterile dressing was applied, followed by a Polar Care sleeve and a SlingShot shoulder immobilizer/sling. The patient was awakened from anesthesia without difficulty and was transferred to the PACU in stable condition.    Of note, assistance from a PA was essential to performing the surgery.  PA was present for the entire surgery.  PA assisted with patient positioning, retraction, instrumentation, and wound closure. The surgery would have been more difficult and had longer operative time without PA assistance.   COMPLICATIONS: none   DISPOSITION: plan for discharge home after recovery in PACU     POSTOPERATIVE PLAN: Remain in sling (except hygiene and elbow/wrist/hand RoM exercises as instructed by PT) x 4 weeks and NWB for this time. PT to begin 3-4 days after surgery.  Small/medium rotator cuff repair rehab protocol + subscapularis restrictions. ASA 325mg  daily x 2 weeks for DVT ppx.

## 2022-11-24 NOTE — Transfer of Care (Signed)
Immediate Anesthesia Transfer of Care Note  Patient: Julian Bates  Procedure(s) Performed: Right shoulder arthroscopic rotator cuff repair, subacromial decompression, distal clavicle excision, and biceps tenodesis (Right: Shoulder)  Patient Location: PACU  Anesthesia Type:GA combined with regional for post-op pain  Level of Consciousness: drowsy and patient cooperative  Airway & Oxygen Therapy: Patient Spontanous Breathing and Patient connected to face mask oxygen  Post-op Assessment: Report given to RN and Post -op Vital signs reviewed and stable  Post vital signs: Reviewed and stable  Last Vitals:  Vitals Value Taken Time  BP 108/75 11/24/22 1330  Temp 35.9 C 11/24/22 1329  Pulse 71 11/24/22 1332  Resp 16 11/24/22 1332  SpO2 100 % 11/24/22 1332  Vitals shown include unvalidated device data.  Last Pain:  Vitals:   11/24/22 0938  TempSrc: Temporal  PainSc: 0-No pain         Complications: No notable events documented.

## 2022-11-24 NOTE — H&P (Signed)
Paper H&P to be scanned into permanent record. H&P reviewed. No significant changes noted.  

## 2022-11-24 NOTE — Anesthesia Preprocedure Evaluation (Signed)
Anesthesia Evaluation  Patient identified by MRN, date of birth, ID band Patient awake    Reviewed: Allergy & Precautions, H&P , NPO status , Patient's Chart, lab work & pertinent test results, reviewed documented beta blocker date and time   History of Anesthesia Complications (+) PONV and history of anesthetic complications  Airway Mallampati: I  TM Distance: >3 FB Neck ROM: full    Dental  (+) Dental Advidsory Given, Caps, Missing, Teeth Intact   Pulmonary neg pulmonary ROS   Pulmonary exam normal breath sounds clear to auscultation       Cardiovascular Exercise Tolerance: Good hypertension, (-) angina (-) Past MI and (-) Cardiac Stents Normal cardiovascular exam(-) dysrhythmias (-) Valvular Problems/Murmurs Rhythm:regular Rate:Normal     Neuro/Psych negative neurological ROS  negative psych ROS   GI/Hepatic ,GERD  ,,(+) neg Cirrhosis      NAFLD   Endo/Other  negative endocrine ROS    Renal/GU negative Renal ROS  negative genitourinary   Musculoskeletal   Abdominal   Peds  Hematology negative hematology ROS (+)   Anesthesia Other Findings Past Medical History: No date: Allergic conjunctivitis No date: Arthritis No date: Cataract 04/20/2013: Degenerative arthritis No date: Hypertension 06/06/2015: NAFLD (nonalcoholic fatty liver disease) No date: PONV (postoperative nausea and vomiting) No date: Seborrhea No date: Thrombocytopenia (HCC)   Reproductive/Obstetrics negative OB ROS                             Anesthesia Physical Anesthesia Plan  ASA: 2  Anesthesia Plan: General   Post-op Pain Management: Regional block*   Induction: Intravenous  PONV Risk Score and Plan: 4 or greater and Propofol infusion, TIVA, Midazolam and Treatment may vary due to age or medical condition  Airway Management Planned: Oral ETT  Additional Equipment:   Intra-op Plan:    Post-operative Plan: Extubation in OR  Informed Consent: I have reviewed the patients History and Physical, chart, labs and discussed the procedure including the risks, benefits and alternatives for the proposed anesthesia with the patient or authorized representative who has indicated his/her understanding and acceptance.     Dental Advisory Given  Plan Discussed with: Anesthesiologist, CRNA and Surgeon  Anesthesia Plan Comments:         Anesthesia Quick Evaluation

## 2022-11-24 NOTE — Anesthesia Procedure Notes (Addendum)
Procedure Name: Intubation Date/Time: 11/24/2022 11:11 AM  Performed by: Rona Ravens, CRNAPre-anesthesia Checklist: Patient identified, Patient being monitored, Timeout performed, Emergency Drugs available and Suction available Patient Re-evaluated:Patient Re-evaluated prior to induction Oxygen Delivery Method: Circle system utilized Preoxygenation: Pre-oxygenation with 100% oxygen Induction Type: IV induction Ventilation: Mask ventilation without difficulty Laryngoscope Size: Mac and 4 Grade View: Grade I Tube type: Oral Tube size: 7.5 mm Number of attempts: 1 Airway Equipment and Method: Stylet Placement Confirmation: ETT inserted through vocal cords under direct vision, positive ETCO2 and breath sounds checked- equal and bilateral Secured at: 23 cm Tube secured with: Tape Dental Injury: Teeth and Oropharynx as per pre-operative assessment

## 2022-11-24 NOTE — Anesthesia Procedure Notes (Signed)
Anesthesia Regional Block: Interscalene brachial plexus block   Pre-Anesthetic Checklist: , timeout performed,  Correct Patient, Correct Site, Correct Laterality,  Correct Procedure, Correct Position, site marked,  Risks and benefits discussed,  Surgical consent,  Pre-op evaluation,  At surgeon's request and post-op pain management  Laterality: Right and Upper  Prep: chloraprep       Needles:  Injection technique: Single-shot  Needle Type: Stimiplex     Needle Length: 5cm  Needle Gauge: 22     Additional Needles:   Procedures:,,,, ultrasound used (permanent image in chart),,    Narrative:  Start time: 11/24/2022 10:53 AM End time: 11/24/2022 10:56 AM Injection made incrementally with aspirations every 5 mL.  Performed by: Personally  Anesthesiologist: Martha Clan, MD  Additional Notes: Functioning IV was confirmed and monitors were applied.  A 55mm 22ga Stimuplex needle was used. Sterile prep and drape,hand hygiene and sterile gloves were used.  Negative aspiration and negative test dose prior to incremental administration of local anesthetic. The patient tolerated the procedure well.

## 2022-11-24 NOTE — Discharge Instructions (Addendum)
Post-Op Instructions - Rotator Cuff Repair  1. Bracing: You will wear a shoulder immobilizer or sling for 4 weeks.   2. Driving: No driving for 4 weeks post-op.  3. Activity: No active lifting for 2 months. Wrist, hand, and elbow motion only. Avoid lifting the upper arm away from the body except for hygiene. You are permitted to bend and straighten the elbow passively only (no active elbow motion). You may use your hand and wrist for typing, writing, and managing utensils (cutting food). Do not lift more than a coffee cup for 8 weeks.  When sleeping or resting, inclined positions (recliner chair or wedge pillow) and a pillow under the forearm for support may provide better comfort for up to 4 weeks.  Avoid long distance travel for 4 weeks.  Return to normal activities after rotator cuff repair repair normally takes 6 months on average. If rehab goes very well, may be able to do most activities at 4 months, except overhead or contact sports.  4. Physical Therapy: Begins 3-4 days after surgery, and proceed 1 time per week for the first 6 weeks, then 1-2 times per week from weeks 6-20 post-op.  5. Medications:  - You will be provided a prescription for narcotic pain medicine. After surgery, take 1-2 narcotic tablets every 4 hours if needed for severe pain.  - A prescription for anti-nausea medication will be provided in case the narcotic medicine causes nausea - take 1 tablet every 6 hours only if nauseated.   - Take tylenol 1000 mg (2 Extra Strength tablets or 3 regular strength) every 8 hours for pain.  May decrease or stop tylenol 5 days after surgery if you are having minimal pain. - Take ASA '325mg'$ /day x 2 weeks to help prevent DVTs/PEs (blood clots).  - DO NOT take ANY nonsteroidal anti-inflammatory pain medications (Advil, Motrin, Ibuprofen, Aleve, Naproxen, or Naprosyn). These medicines can inhibit healing of your shoulder repair.    If you are taking prescription medication for anxiety,  depression, insomnia, muscle spasm, chronic pain, or for attention deficit disorder, you are advised that you are at a higher risk of adverse effects with use of narcotics post-op, including narcotic addiction/dependence, depressed breathing, death. If you use non-prescribed substances: alcohol, marijuana, cocaine, heroin, methamphetamines, etc., you are at a higher risk of adverse effects with use of narcotics post-op, including narcotic addiction/dependence, depressed breathing, death. You are advised that taking > 50 morphine milligram equivalents (MME) of narcotic pain medication per day results in twice the risk of overdose or death. For your prescription provided: oxycodone 5 mg - taking more than 6 tablets per day would result in > 50 morphine milligram equivalents (MME) of narcotic pain medication. Be advised that we will prescribe narcotics short-term, for acute post-operative pain only - 3 weeks for major operations such as shoulder repair/reconstruction surgeries.     6. Post-Op Appointment:  Your first post-op appointment will be 10-14 days post-op.  7. Work or School: For most, but not all procedures, we advise staying out of work or school for at least 1 to 2 weeks in order to recover from the stress of surgery and to allow time for healing.   If you need a work or school note this can be provided.   8. Smoking: If you are a smoker, you need to refrain from smoking in the postoperative period. The nicotine in cigarettes will inhibit healing of your shoulder repair and decrease the chance of successful repair. Similarly, nicotine containing products (  gum, patches) should be avoided.   Post-operative Brace: Apply and remove the brace you received as you were instructed to at the time of fitting and as described in detail as the brace's instructions for use indicate.  Wear the brace for the period of time prescribed by your physician.  The brace can be cleaned with soap and water and  allowed to air dry only.  Should the brace result in increased pain, decreased feeling (numbness/tingling), increased swelling or an overall worsening of your medical condition, please contact your doctor immediately.  If an emergency situation occurs as a result of wearing the brace after normal business hours, please dial 911 and seek immediate medical attention.  Let your doctor know if you have any further questions about the brace issued to you. Refer to the shoulder sling instructions for use if you have any questions regarding the correct fit of your shoulder sling.  [place] for Troubleshooting: [phone]  Video that illustrates how to properly use a shoulder sling: "Instructions for Proper Use of an Orthopaedic Sling" ShoppingLesson.hu    AMBULATORY SURGERY  DISCHARGE INSTRUCTIONS   The drugs that you were given will stay in your system until tomorrow so for the next 24 hours you should not:  Drive an automobile Make any legal decisions Drink any alcoholic beverage   You may resume regular meals tomorrow.  Today it is better to start with liquids and gradually work up to solid foods.  You may eat anything you prefer, but it is better to start with liquids, then soup and crackers, and gradually work up to solid foods.   Please notify your doctor immediately if you have any unusual bleeding, trouble breathing, redness and pain at the surgery site, drainage, fever, or pain not relieved by medication.     Your post-operative visit with Dr.                                       is: Date:                        Time:    Please call to schedule your post-operative visit.  Additional Instructions:  POR FAVOR DEJE EL BRACALETE VERDE PUESTO POR 4 DIAS     Interscalene Nerve Block with Exparel   For your surgery you have received an Interscalene Nerve Block with Exparel. Nerve Blocks affect many types of nerves, including nerves that  control movement, pain and normal sensation.  You may experience feelings such as numbness, tingling, heaviness, weakness or the inability to move your arm or the feeling or sensation that your arm has "fallen asleep". A nerve block with Exparel can last up to 5 days.  Usually the weakness wears off first.  The tingling and heaviness usually wear off next.  Finally you may start to notice pain.  Keep in mind that this may occur in any order.  Once a nerve block starts to wear off it is usually completely gone within 60 minutes. ISNB may cause mild shortness of breath, a hoarse voice, blurry vision, unequal pupils, or drooping of the face on the same side as the nerve block.  These symptoms will usually resolve with the numbness.  Very rarely the procedure itself can cause mild seizures. If needed, your surgeon will give you a prescription for pain medication.  It will take about  60 minutes for the oral pain medication to become fully effective.  So, it is recommended that you start taking this medication before the nerve block first begins to wear off, or when you first begin to feel discomfort. Take your pain medication only as prescribed.  Pain medication can cause sedation and decrease your breathing if you take more than you need for the level of pain that you have. Nausea is a common side effect of many pain medications.  You may want to eat something before taking your pain medicine to prevent nausea. After an Interscalene nerve block, you cannot feel pain, pressure or extremes in temperature in the effected arm.  Because your arm is numb it is at an increased risk for injury.  To decrease the possibility of injury, please practice the following:  While you are awake change the position of your arm frequently to prevent too much pressure on any one area for prolonged periods of time.  If you have a cast or tight dressing, check the color or your fingers every couple of hours.  Call your surgeon with  the appearance of any discoloration (white or blue). If you are given a sling to wear before you go home, please wear it  at all times until the block has completely worn off.  Do not get up at night without your sling. Please contact [facility] or your surgeon if you do not begin to regain sensation after 7 days from the surgery.  Anesthesia may be contacted by calling the Same Day Surgery Department, Mon. through Fri., 6 am to 4 pm at [phone].   If you experience any other problems or concerns, please contact your surgeon's office. If you experience severe or prolonged shortness of breath go to the nearest emergency department.  POLAR CARE INFORMATION  MassAdvertisement.it  How to use Breg Polar Care [facility] Therapy System?  YouTube   ShippingScam.co.uk  OPERATING INSTRUCTIONS  Start the product With dry hands, connect the transformer to the electrical connection located on the top of the cooler. Next, plug the transformer into an appropriate electrical outlet. The unit will automatically start running at this point.  To stop the pump, disconnect electrical power.  Unplug to stop the product when not in use. Unplugging the Polar Care unit turns it off. Always unplug immediately after use. Never leave it plugged in while unattended. Remove pad.    FIRST ADD WATER TO FILL LINE, THEN ICE---Replace ice when existing ice is almost melted  1 Discuss Treatment with your Licensed Health Care Practitioner and Use Only as Prescribed 2 Apply Insulation Barrier & Cold Therapy Pad 3 Check for Moisture 4 Inspect Skin Regularly  Tips and Trouble Shooting Usage Tips 1. Use cubed or chunked ice for optimal performance. 2. It is recommended to drain the Pad between uses. To drain the pad, hold the Pad upright with the hose pointed toward the ground. Depress the black plunger and allow water to drain out. 3. You may disconnect the Pad from the unit without removing the  pad from the affected area by depressing the silver tabs on the hose coupling and gently pulling the hoses apart. The Pad and unit will seal itself and will not leak. Note: Some dripping during release is normal. 4. DO NOT RUN PUMP WITHOUT WATER! The pump in this unit is designed to run with water. Running the unit without water will cause permanent damage to the pump. 5. Unplug unit before removing lid.  TROUBLESHOOTING  GUIDE Pump not running, Water not flowing to the pad, Pad is not getting cold 1. Make sure the transformer is plugged into the wall outlet. 2. Confirm that the ice and water are filled to the indicated levels. 3. Make sure there are no kinks in the pad. 4. Gently pull on the blue tube to make sure the tube/pad junction is straight. 5. Remove the pad from the treatment site and ll it while the pad is lying at; then reapply. 6. Confirm that the pad couplings are securely attached to the unit. Listen for the double clicks (Figure 1) to confirm the pad couplings are securely attached.  Leaks    Note: Some condensation on the lines, controller, and pads is unavoidable, especially in warmer climates. 1. If using a Breg Polar Care Cold Therapy unit with a detachable Cold Therapy Pad, and a leak exists (other than condensation on the lines) disconnect the pad couplings. Make sure the silver tabs on the couplings are depressed before reconnecting the pad to the pump hose; then confirm both sides of the coupling are properly clicked in. 2. If the coupling continues to leak or a leak is detected in the pad itself, stop using it and call [place] at [phone].  Cleaning After use, empty and dry the unit with a soft cloth. Warm water and mild detergent may be used occasionally to clean the pump and tubes.  WARNING: The [place] can be cold enough to cause serious injury, including full skin necrosis. Follow these Operating Instructions, and carefully read the Product  Insert (see pouch on side of unit) and the Cold Therapy Pad Fitting Instructions (provided with each Cold Therapy Pad) prior to use.   SHOULDER SLING IMMOBILIZER   VIDEO Slingshot 2 Shoulder Brace Application - YouTube ---[email or web address].willis-schwartz.[email or web address]/  INSTRUCTIONS While supporting the injured arm, slide the forearm into the sling. Wrap the adjustable shoulder strap around the neck and shoulders and attach the strap end to the sling using  the "alligator strap tab."  Adjust the shoulder strap to the required length. Position the shoulder pad behind the neck. To secure the shoulder pad location (optional), pull the shoulder strap away from the shoulder pad, unfold the hook material on the top of the pad, then press the shoulder strap back onto the hook material to secure the pad in place. Attach the closure strap across the open top of the sling. Position the strap so that it holds the arm securely in the sling. Next, attach the thumb strap to the open end of the sling between the thumb and fingers. After sling has been fit, it may be easily removed and reapplied using the quick release buckle on shoulder strap. If a neutral pillow or 15 abduction pillow is included, place the pillow at the waistline. Attach the sling to the pillow, lining up hook material on the pillow with the loop on sling. Adjust the waist strap to fit.  If waist strap is too long, cut it to fit. Use the small piece of double sided hook material (located on top of the pillow) to secure the strap end. Place the double sided hook material on the inside of the cut strap end and secure it to the waist strap.     If no pillow is included, attach the waist strap to the sling and adjust to fit.    Washing Instructions: Straps and sling must be removed and cleaned regularly depending on your activity level and perspiration.  Hand wash straps and sling in cold water with mild detergent, rinse, air dry

## 2022-11-26 NOTE — Anesthesia Postprocedure Evaluation (Signed)
Anesthesia Post Note  Patient: Julian Bates  Procedure(s) Performed: Right shoulder arthroscopic rotator cuff repair, subacromial decompression, distal clavicle excision, and biceps tenodesis (Right: Shoulder)  Patient location during evaluation: PACU Anesthesia Type: General Level of consciousness: awake and alert Pain management: pain level controlled Vital Signs Assessment: post-procedure vital signs reviewed and stable Respiratory status: spontaneous breathing, nonlabored ventilation, respiratory function stable and patient connected to nasal cannula oxygen Cardiovascular status: blood pressure returned to baseline and stable Postop Assessment: no apparent nausea or vomiting Anesthetic complications: no   No notable events documented.   Last Vitals:  Vitals:   11/24/22 1430 11/24/22 1452  BP: 123/74 (!) 144/88  Pulse: 63 62  Resp: 12 15  Temp:    SpO2: 97% 100%    Last Pain:  Vitals:   11/24/22 1452  TempSrc:   PainSc: 0-No pain                 Martha Clan

## 2024-06-06 ENCOUNTER — Ambulatory Visit: Admitting: Cardiovascular Disease

## 2024-06-19 ENCOUNTER — Ambulatory Visit: Admitting: Urology

## 2024-06-19 DIAGNOSIS — N3941 Urge incontinence: Secondary | ICD-10-CM

## 2024-09-11 ENCOUNTER — Ambulatory Visit: Admitting: Urology

## 2024-09-11 VITALS — BP 151/100 | HR 84 | Ht 66.0 in | Wt 154.2 lb

## 2024-09-11 DIAGNOSIS — N3941 Urge incontinence: Secondary | ICD-10-CM | POA: Diagnosis not present

## 2024-09-11 LAB — URINALYSIS, COMPLETE
Bilirubin, UA: NEGATIVE
Glucose, UA: NEGATIVE
Ketones, UA: NEGATIVE
Leukocytes,UA: NEGATIVE
Nitrite, UA: NEGATIVE
Protein,UA: NEGATIVE
RBC, UA: NEGATIVE
Specific Gravity, UA: 1.025 (ref 1.005–1.030)
Urobilinogen, Ur: 0.2 mg/dL (ref 0.2–1.0)
pH, UA: 6 (ref 5.0–7.5)

## 2024-09-11 LAB — MICROSCOPIC EXAMINATION

## 2024-09-11 NOTE — Progress Notes (Signed)
 09/11/2024 8:51 AM   Julian Bates 1966/10/24 [id number]  Referring provider: Claudene Rayfield HERO, MD [address] [place],  [place] [address]  Chief Complaint  Patient presents with   New Patient (Initial Visit)   Establish Care   Urinary Incontinence    HPI: I was consulted to assess the patient's episode of urgency 2 months ago.  It did normalize.  Now he voids every 2-4 hours with a good flow.  Sometimes is lower.  Sometimes gets up once a night.  He is not bothered by symptoms  He saw Dr. Twylla I number years ago for rectal dysfunction.  Sometimes the rectum is work well and then other times there rapidly loss in the middle of intercourse.  Does not take nitroglycerin.  It appears he was given Cialis  in 2020   PMH: Past Medical History:  Diagnosis Date   Allergic conjunctivitis    Arthritis    Cataract    Degenerative arthritis 04/20/2013   Hypertension    NAFLD (nonalcoholic fatty liver disease) 92/71/7983   PONV (postoperative nausea and vomiting)    Seborrhea    Thrombocytopenia     Surgical History: Past Surgical History:  Procedure Laterality Date   COLONOSCOPY     HEMORRHOID BANDING     HERNIA REPAIR     x 3    Home Medications:  Allergies as of 09/11/2024       Reactions   Tramadol  Nausea Only        Medication List        Accurate as of September 11, 2024  8:51 AM. If you have any questions, ask your nurse or doctor.          STOP taking these medications    atenolol  50 MG tablet Commonly known as: TENORMIN    ketoconazole  2 % shampoo Commonly known as: NIZORAL    Olopatadine  HCl 0.2 % Soln   OMEGA-3 FISH OIL/VITAMIN D3 PO       TAKE these medications    ascorbic acid 100 MG tablet Commonly known as: VITAMIN C Take 100 mg by mouth daily.   Cholecalciferol 25 MCG (1000 UT) capsule Take 1,000 Units by mouth daily.   desonide  0.05 % cream Commonly known as: DESOWEN  APPLY TO AFFECTED AREA TWICE A DAY    enalapril  20 MG tablet Commonly known as: VASOTEC  Take 1 tablet (20 mg total) by mouth daily.   fluticasone  50 MCG/ACT nasal spray Commonly known as: FLONASE  Place into both nostrils daily.   hydrochlorothiazide 12.5 MG tablet Commonly known as: HYDRODIURIL Take 12.5 mg by mouth daily.   hydrocortisone  25 MG suppository Commonly known as: Anucort-HC  INSERT ONE SUPPOSITORY RECTALLY TWICE DAILY   LUBRICANT EYE DROPS OP Apply to eye.   ondansetron  4 MG disintegrating tablet Commonly known as: ZOFRAN -ODT Take 1 tablet (4 mg total) by mouth every 8 (eight) hours as needed for nausea or vomiting.   OVER THE COUNTER MEDICATION WHEAT DEXTRIN/ASPARTAME (BENEFIBER S/F, WHEAT DEXTRIN, ORAL)   tadalafil  20 MG tablet Commonly known as: CIALIS  1 tablet by mouth 1 hour prior to intercourse   traZODone 50 MG tablet Commonly known as: DESYREL Take 50 mg by mouth at bedtime.   valACYclovir  500 MG tablet Commonly known as: VALTREX  TOME UNA TABLETA CADA DIA        Allergies:  Allergies  Allergen Reactions   Tramadol  Nausea Only    Family History: Family History  Problem Relation Age of Onset   Arthritis Mother  Diabetes Mother    Hypertension Mother    Arthritis Father    Prostate cancer Father    Stroke Father        CVA x 5   Hypertension Father    Diabetes Father        with leg ampuation   Cancer Father 51       prostate cancer   Diabetes Brother    HIV Brother    Colon cancer Maternal Grandfather     Social History:  reports that he has never smoked. He has never used smokeless tobacco. He reports that he does not currently use alcohol. He reports that he does not use drugs.  ROS:                                        Physical Exam: There were no vitals taken for this visit.  Constitutional:  Alert and oriented, No acute distress. HEENT: [place] AT, moist mucus membranes.  Trachea midline, no masses.   Laboratory Data: Lab  Results  Component Value Date   WBC 7.9 11/19/2022   HGB 16.4 11/19/2022   HCT 46.2 11/19/2022   MCV 89.0 11/19/2022   PLT 149 (L) 11/19/2022    Lab Results  Component Value Date   CREATININE 0.74 11/19/2022    Lab Results  Component Value Date   PSA 0.24 12/25/2015   PSA 0.36 07/25/2014    Lab Results  Component Value Date   TESTOSTERONE  283 04/24/2019    Lab Results  Component Value Date   HGBA1C 4.9 01/20/2017    Urinalysis    Component Value Date/Time   BILIRUBINUR negative 01/20/2017 1036   BILIRUBINUR neg 07/25/2014 0948   KETONESUR negative 01/20/2017 1036   PROTEINUR negative 01/20/2017 1036   PROTEINUR neg 07/25/2014 0948   UROBILINOGEN 1.0 01/20/2017 1036   NITRITE Negative 01/20/2017 1036   NITRITE neg 07/25/2014 0948   LEUKOCYTESUR Negative 01/20/2017 1036    Pertinent Imaging: Urine normal.  Urine sent for culture.  Chart reviewed  Assessment & Plan: Patient has normal voiding function.  I do not think he needs cystoscopy. The patient went on to say that he is already seeing doctors in [place] and he had testosterone  levels ranging from 400-900.  It turns out he is injecting himself once a month.  He had a lengthy discussion about Cialis  and how it causes some flushing unless he uses 5 mg.  There is a delayed onset.  The conversation became very lengthy.  I answered multiple questions through the translator.  I urged him to follow-up in [place] and offered another appointment here from 1 to the other urologist that perhaps could answer some of his questions.  He said he cannot take Viagra  because it causes even more flushing.  I think he was here more for an erectile dysfunction consultation versus urinary voiding dysfunction.  The translator did an excellent job.  I made it very clear that I do not treat hypogonadism and did not have the expertise to manage him.  1. Urge incontinence (Primary)  - Urinalysis, Complete   No follow-ups on  file.  Glendia DELENA Elizabeth, MD  [facility] [address], [address] [place], [place] [address] [phone]

## 2024-09-18 ENCOUNTER — Other Ambulatory Visit: Payer: Self-pay

## 2024-09-18 DIAGNOSIS — N3941 Urge incontinence: Secondary | ICD-10-CM

## 2024-09-18 LAB — CULTURE, URINE COMPREHENSIVE

## 2024-09-18 NOTE — Progress Notes (Unsigned)
 LVM for pt to return call.  Per Dr. MacDiarmid, the patient needs an antibiotic based on urine culture results. Antibiotic pended, waiting on pt to verify pharmacy.

## 2024-09-19 MED ORDER — NITROFURANTOIN MACROCRYSTAL 100 MG PO CAPS: 100.0000 mg | ORAL_CAPSULE | Freq: Two times a day (BID) | ORAL | 0 refills | Status: AC

## 2024-09-19 NOTE — Progress Notes (Signed)
 Pt informed of urine culture results, per Dr.MacDiarmid an antibiotic was sent in to pt's preferred pharmacy, pt voiced understanding.

## 2024-12-11 ENCOUNTER — Encounter: Payer: Self-pay | Admitting: Gastroenterology
